# Patient Record
Sex: Male | Born: 1950 | Race: White | Hispanic: No | Marital: Married | Smoking: Former smoker
Health system: Southern US, Community
[De-identification: ages and names within clinical notes are randomized; demographics above are authoritative.]

## PROBLEM LIST (undated history)

## (undated) DIAGNOSIS — S42302A Unspecified fracture of shaft of humerus, left arm, initial encounter for closed fracture: Secondary | ICD-10-CM

## (undated) DIAGNOSIS — I10 Essential (primary) hypertension: Secondary | ICD-10-CM

## (undated) DIAGNOSIS — E785 Hyperlipidemia, unspecified: Secondary | ICD-10-CM

## (undated) DIAGNOSIS — Z973 Presence of spectacles and contact lenses: Secondary | ICD-10-CM

## (undated) DIAGNOSIS — J302 Other seasonal allergic rhinitis: Secondary | ICD-10-CM

## (undated) DIAGNOSIS — S82899A Other fracture of unspecified lower leg, initial encounter for closed fracture: Secondary | ICD-10-CM

## (undated) DIAGNOSIS — I2510 Atherosclerotic heart disease of native coronary artery without angina pectoris: Secondary | ICD-10-CM

## (undated) DIAGNOSIS — M1990 Unspecified osteoarthritis, unspecified site: Secondary | ICD-10-CM

## (undated) DIAGNOSIS — Z974 Presence of external hearing-aid: Secondary | ICD-10-CM

## (undated) HISTORY — PX: WRIST FUSION: SHX839

## (undated) HISTORY — PX: SHOULDER ARTHROSCOPY W/ ROTATOR CUFF REPAIR: SHX2400

## (undated) HISTORY — PX: RHINOPLASTY: SUR1284

## (undated) HISTORY — PX: KNEE SURGERY: SHX244

## (undated) HISTORY — PX: CORONARY ANGIOPLASTY WITH STENT PLACEMENT: SHX49

---

## 2009-01-20 ENCOUNTER — Ambulatory Visit (HOSPITAL_COMMUNITY): Admission: RE | Admit: 2009-01-20 | Discharge: 2009-01-21 | Payer: Self-pay | Admitting: Orthopedic Surgery

## 2011-01-19 LAB — CBC
Platelets: 192 10*3/uL (ref 150–400)
RDW: 13.9 % (ref 11.5–15.5)

## 2011-01-19 LAB — COMPREHENSIVE METABOLIC PANEL
ALT: 21 U/L (ref 0–53)
Albumin: 3.6 g/dL (ref 3.5–5.2)
Alkaline Phosphatase: 60 U/L (ref 39–117)
Calcium: 8.8 mg/dL (ref 8.4–10.5)
Potassium: 4.4 mEq/L (ref 3.5–5.1)
Sodium: 142 mEq/L (ref 135–145)
Total Protein: 5.9 g/dL — ABNORMAL LOW (ref 6.0–8.3)

## 2011-01-19 LAB — APTT: aPTT: 28 seconds (ref 24–37)

## 2011-01-19 LAB — DIFFERENTIAL
Basophils Relative: 1 % (ref 0–1)
Eosinophils Absolute: 0.2 10*3/uL (ref 0.0–0.7)
Lymphs Abs: 1.4 10*3/uL (ref 0.7–4.0)
Monocytes Absolute: 0.6 10*3/uL (ref 0.1–1.0)
Monocytes Relative: 9 % (ref 3–12)
Neutro Abs: 4.2 10*3/uL (ref 1.7–7.7)

## 2011-01-19 LAB — URINALYSIS, ROUTINE W REFLEX MICROSCOPIC
Bilirubin Urine: NEGATIVE
Nitrite: NEGATIVE
Specific Gravity, Urine: 1.022 (ref 1.005–1.030)
Urobilinogen, UA: 1 mg/dL (ref 0.0–1.0)
pH: 7.5 (ref 5.0–8.0)

## 2011-01-19 LAB — URINE MICROSCOPIC-ADD ON

## 2011-02-22 NOTE — Op Note (Signed)
Christopher Bates, Christopher Bates           ACCOUNT NO.:  [id number]   MEDICAL RECORD NO.:  [id number]          PATIENT TYPE:  OIB   LOCATION:  0098                         FACILITY:  [facility]   PHYSICIAN:  Georges Lynch. Gioffre, M.D.DATE OF BIRTH:  Feb 24, 1951   DATE OF PROCEDURE:  DATE OF DISCHARGE:                               OPERATIVE REPORT   PREOPERATIVE DIAGNOSES:  1. Complete tear of the rotator cuff tendon with retraction, left      shoulder.  2. Severe impingement syndrome, left shoulder.   POSTOPERATIVE DIAGNOSES:  1. Complete tear of the rotator cuff tendon with retraction, left      shoulder.  2. Severe impingement syndrome, left shoulder.   OPERATION:  1. Open acromionectomy and acromioplasty, left shoulder.  2. Repair of a completely retracted tear of the rotator cuff tendon,      left shoulder.  3. TissueMend graft to the left shoulder rotator cuff with 1 anchor,      PEEK anchor.   PROCEDURE:  Under general anesthesia, the patient first had 1 gram of IV  Ancef.  Routine orthopedic prep and drape in the left shoulder was  carried out.  Incision was made over the anterior aspect of the left  shoulder, bleeders identified and cauterized.  At this time, the deltoid  tendon was separated by sharp dissection from the acromion.  I  identified the subacromial bursa and I excised that, and it was quite  thickened and inflamed.  I incised a small proximal part of the deltoid  muscle.  I inserted the Bennett retractor to protect the cuff and then  did a partial acromionectomy and acromioplasty, utilizing oscillating  saw on the bur and then thoroughly irrigated out the area, bone waxed  the undersurface of the acromion.  Note, he had a horseshoe-like-type  tear of the rotator cuff with some retraction.  I utilized a bur to bur  down the lateral articular surface of the humerus.  I then inserted one  PEEK anchor.  I then first did a primary repair of the tear and a side-  to-side  suture, and following that I then applied the TissueMend graft  for reinforcement and affixed it to the humerus distally with a PEEK  anchor.  Thoroughly irrigated out the area, reapproximated deltoid  tendon and muscle in the usual fashion.  Subcu was closed with 0-Vicryl,  skin with metal staples.  Sterile Neosporin dressing was applied and he  was placed in a shoulder immobilizer.   SURGEON:  Georges Lynch. Darrelyn Hillock, M.D.   ASSISTANT:  Nurse.            ______________________________  Georges Lynch. Darrelyn Hillock, M.D.     RAG/MEDQ  D:  01/20/2009  T:  01/20/2009  Job:  [id number]

## 2013-06-08 ENCOUNTER — Emergency Department (INDEPENDENT_AMBULATORY_CARE_PROVIDER_SITE_OTHER)
Admission: EM | Admit: 2013-06-08 | Discharge: 2013-06-08 | Disposition: A | Discharge status: Home / Self Care | Payer: Managed Care, Other (non HMO) | Source: Home / Self Care | Attending: Family Medicine | Admitting: Family Medicine

## 2013-06-08 ENCOUNTER — Encounter: Payer: Self-pay | Admitting: Emergency Medicine

## 2013-06-08 DIAGNOSIS — H6980 Other specified disorders of Eustachian tube, unspecified ear: Secondary | ICD-10-CM

## 2013-06-08 DIAGNOSIS — H8110 Benign paroxysmal vertigo, unspecified ear: Secondary | ICD-10-CM

## 2013-06-08 DIAGNOSIS — H6983 Other specified disorders of Eustachian tube, bilateral: Secondary | ICD-10-CM

## 2013-06-08 HISTORY — DX: Other seasonal allergic rhinitis: J30.2

## 2013-06-08 HISTORY — DX: Hyperlipidemia, unspecified: E78.5

## 2013-06-08 HISTORY — DX: Essential (primary) hypertension: I10

## 2013-06-08 HISTORY — DX: Atherosclerotic heart disease of native coronary artery without angina pectoris: I25.10

## 2013-06-08 MED ORDER — MECLIZINE HCL 25 MG PO TABS: ORAL_TABLET | ORAL | Status: AC

## 2013-06-08 MED ORDER — FLUTICASONE PROPIONATE 50 MCG/ACT NA SUSP: NASAL | Status: AC

## 2013-06-08 NOTE — ED Provider Notes (Signed)
CSN: [id number]     Arrival date & time 06/08/13  1100 History   First MD Initiated Contact with Patient 06/08/13 1155     Chief Complaint  Patient presents with  . Dizziness      HPI Comments: Patient complains of persistent sinus congestion and intermittent dizziness since April.  Congestion had not improved with Claritin D.  Two weeks ago his dizziness became more persistent (described as sensation of movement, spinning, off-balance) worse with movement and in bed.  He feels pressure in his ears.  No fevers, chills, and sweats.  No headache.  No neurologic symptoms otherwise.  The history is provided by the patient.    Past Medical History  Diagnosis Date  . Coronary artery disease   . Hypertension   . Hyperlipidemia   . Seasonal allergies    Past Surgical History  Procedure Laterality Date  . Coronary angioplasty with stent placement    . Wrist fusion Right   . Rhinoplasty    . Shoulder arthroscopy w/ rotator cuff repair Left   . Knee surgery Right    Family History  Problem Relation Age of Onset  . Heart failure Father   . Diabetes Father   . Heart failure Sister   . Heart failure Brother   . Heart failure Paternal Uncle    History  Substance Use Topics  . Smoking status: Former Games developer  . Smokeless tobacco: Not on file  . Alcohol Use: Yes    Review of Systems  Constitutional: Negative for fever, chills, activity change and fatigue.  HENT: Positive for ear pain, congestion and postnasal drip. Negative for hearing loss, nosebleeds, sore throat, facial swelling, rhinorrhea, sneezing, trouble swallowing, sinus pressure, tinnitus and ear discharge.   Eyes: Negative.   Respiratory: Negative.   Cardiovascular: Negative.   Gastrointestinal: Negative.   Genitourinary: Negative.   Musculoskeletal: Negative.   Skin: Negative.   Neurological: Positive for dizziness. Negative for syncope, speech difficulty, weakness, light-headedness, numbness and headaches.     Allergies  Review of patient's allergies indicates no known allergies.  Home Medications   Current Outpatient Rx  Name  Route  Sig  Dispense  Refill  . aspirin 81 MG tablet   Oral   Take by mouth daily.         . clopidogrel (PLAVIX) 75 MG tablet   Oral   Take 75 mg by mouth daily.         Marland Kitchen lisinopril-hydrochlorothiazide (PRINZIDE,ZESTORETIC) 10-12.5 MG per tablet   Oral   Take 1 tablet by mouth daily.         Marland Kitchen loratadine-pseudoephedrine (CLARITIN-D 24-HOUR) 10-240 MG per 24 hr tablet   Oral   Take 1 tablet by mouth daily.         . metoprolol succinate (TOPROL-XL) 25 MG 24 hr tablet   Oral   Take 25 mg by mouth daily.         . simvastatin (ZOCOR) 40 MG tablet   Oral   Take 40 mg by mouth every evening.         . fluticasone (FLONASE) 50 MCG/ACT nasal spray      Place two sprays in each nostril once daily   16 g   1   . meclizine (ANTIVERT) 25 MG tablet      Take one tab by mouth 2 or 3 times daily as needed for dizziness   15 tablet   0    BP 121/75  Pulse 69  Temp(Src) 97.9 F (36.6 C) (Oral)  Resp 16  Ht 5\' 11"  (1.803 m)  Wt 195 lb (88.451 kg)  BMI 27.21 kg/m2  SpO2 98% Physical Exam Nursing notes and Vital Signs reviewed. Appearance:  Patient appears healthy, stated age, and in no acute distress Eyes:  Pupils are equal, round, and reactive to light and accomodation.  Extraocular movement is intact.  Conjunctivae are not inflamed.  Fundi are benign.  Slight nystagmus noted on lateral gaze.  Ears:  Canals normal.  Tympanic membranes appear retracted, otherwise normal Nose:  Congested turbinates.  No sinus tenderness.    Pharynx:  Normal Neck:  Supple.  No adenopathy.  Carotids normal. Lungs:  Clear to auscultation.  Breath sounds are equal.  Heart:  Regular rate and rhythm without murmurs, rubs, or gallops.  Abdomen:  Nontender without masses or hepatosplenomegaly.  Bowel sounds are present.  No CVA or flank tenderness.   Extremities:  No edema.  No calf tenderness Skin:  No rash present. Neurologic:  Cranial nerves 2 through 12 are normal.  .      ED Course  Procedures  none        MDM   1. Benign paroxysmal positional vertigo   2. Eustachian tube dysfunction, bilateral    Begin Antivert.  Begin Flonase nasal spray. May use Afrin nasal spray (or generic oxymetazoline) twice daily for about 5 days.  Also recommend using saline nasal spray several times daily and saline nasal irrigation (AYR is a common brand).  Use Flonase nasal spray after using Afrin and saline sprays. Stop all antihistamines for now, and other non-prescription cough/cold preparations. Followup with ENT if not improved two weeks.    Lattie Haw, MD 06/10/13 1028

## 2013-06-08 NOTE — ED Notes (Signed)
Reports intermittent dizziness since April/2014; became very bad during this past night when every change of position worsened it. Feels pressure in both ears.

## 2017-10-27 ENCOUNTER — Encounter (HOSPITAL_COMMUNITY)
Admission: EM | Disposition: A | Discharge status: Home / Self Care | Payer: Self-pay | Source: Home / Self Care | Attending: Emergency Medicine

## 2017-10-27 ENCOUNTER — Emergency Department (HOSPITAL_COMMUNITY): Payer: Medicare Other | Admitting: Anesthesiology

## 2017-10-27 ENCOUNTER — Emergency Department (HOSPITAL_COMMUNITY): Payer: Medicare Other

## 2017-10-27 ENCOUNTER — Observation Stay (HOSPITAL_COMMUNITY): Payer: Medicare Other

## 2017-10-27 ENCOUNTER — Observation Stay (HOSPITAL_COMMUNITY)
Admission: EM | Admit: 2017-10-27 | Discharge: 2017-10-28 | Disposition: A | Discharge status: Home / Self Care | Payer: Medicare Other | Attending: Orthopedic Surgery | Admitting: Orthopedic Surgery

## 2017-10-27 ENCOUNTER — Encounter (HOSPITAL_COMMUNITY): Payer: Self-pay

## 2017-10-27 DIAGNOSIS — Z87891 Personal history of nicotine dependence: Secondary | ICD-10-CM | POA: Insufficient documentation

## 2017-10-27 DIAGNOSIS — Y9389 Activity, other specified: Secondary | ICD-10-CM | POA: Diagnosis not present

## 2017-10-27 DIAGNOSIS — I2510 Atherosclerotic heart disease of native coronary artery without angina pectoris: Secondary | ICD-10-CM | POA: Diagnosis not present

## 2017-10-27 DIAGNOSIS — S8262XA Displaced fracture of lateral malleolus of left fibula, initial encounter for closed fracture: Principal | ICD-10-CM | POA: Insufficient documentation

## 2017-10-27 DIAGNOSIS — Z79899 Other long term (current) drug therapy: Secondary | ICD-10-CM | POA: Insufficient documentation

## 2017-10-27 DIAGNOSIS — Z981 Arthrodesis status: Secondary | ICD-10-CM | POA: Diagnosis not present

## 2017-10-27 DIAGNOSIS — I10 Essential (primary) hypertension: Secondary | ICD-10-CM | POA: Insufficient documentation

## 2017-10-27 DIAGNOSIS — Z833 Family history of diabetes mellitus: Secondary | ICD-10-CM | POA: Insufficient documentation

## 2017-10-27 DIAGNOSIS — R2681 Unsteadiness on feet: Secondary | ICD-10-CM | POA: Diagnosis not present

## 2017-10-27 DIAGNOSIS — Z8249 Family history of ischemic heart disease and other diseases of the circulatory system: Secondary | ICD-10-CM | POA: Diagnosis not present

## 2017-10-27 DIAGNOSIS — R609 Edema, unspecified: Secondary | ICD-10-CM

## 2017-10-27 DIAGNOSIS — Z955 Presence of coronary angioplasty implant and graft: Secondary | ICD-10-CM | POA: Diagnosis not present

## 2017-10-27 DIAGNOSIS — R52 Pain, unspecified: Secondary | ICD-10-CM

## 2017-10-27 DIAGNOSIS — S52592A Other fractures of lower end of left radius, initial encounter for closed fracture: Secondary | ICD-10-CM | POA: Insufficient documentation

## 2017-10-27 DIAGNOSIS — S93315A Dislocation of tarsal joint of left foot, initial encounter: Secondary | ICD-10-CM | POA: Diagnosis not present

## 2017-10-27 DIAGNOSIS — Z419 Encounter for procedure for purposes other than remedying health state, unspecified: Secondary | ICD-10-CM

## 2017-10-27 DIAGNOSIS — S9305XA Dislocation of left ankle joint, initial encounter: Secondary | ICD-10-CM | POA: Diagnosis present

## 2017-10-27 DIAGNOSIS — S82892A Other fracture of left lower leg, initial encounter for closed fracture: Secondary | ICD-10-CM | POA: Diagnosis present

## 2017-10-27 DIAGNOSIS — Y9289 Other specified places as the place of occurrence of the external cause: Secondary | ICD-10-CM | POA: Insufficient documentation

## 2017-10-27 DIAGNOSIS — S82899A Other fracture of unspecified lower leg, initial encounter for closed fracture: Secondary | ICD-10-CM | POA: Diagnosis present

## 2017-10-27 DIAGNOSIS — Z7982 Long term (current) use of aspirin: Secondary | ICD-10-CM | POA: Insufficient documentation

## 2017-10-27 DIAGNOSIS — E785 Hyperlipidemia, unspecified: Secondary | ICD-10-CM | POA: Diagnosis not present

## 2017-10-27 HISTORY — PX: EXTERNAL FIXATION LEG: SHX1549

## 2017-10-27 LAB — CBC
HCT: 44.5 % (ref 39.0–52.0)
Hemoglobin: 14.8 g/dL (ref 13.0–17.0)
MCH: 33.3 pg (ref 26.0–34.0)
MCHC: 33.3 g/dL (ref 30.0–36.0)
MCV: 100.2 fL — ABNORMAL HIGH (ref 78.0–100.0)
PLATELETS: 181 10*3/uL (ref 150–400)
RBC: 4.44 MIL/uL (ref 4.22–5.81)
RDW: 13.4 % (ref 11.5–15.5)
WBC: 11.3 10*3/uL — ABNORMAL HIGH (ref 4.0–10.5)

## 2017-10-27 LAB — BASIC METABOLIC PANEL
Anion gap: 8 (ref 5–15)
BUN: 16 mg/dL (ref 6–20)
CO2: 24 mmol/L (ref 22–32)
CREATININE: 1.17 mg/dL (ref 0.61–1.24)
Calcium: 8.2 mg/dL — ABNORMAL LOW (ref 8.9–10.3)
Chloride: 107 mmol/L (ref 101–111)
GFR calc Af Amer: >60 mL/min (ref >60)
GFR calc non Af Amer: >60 mL/min (ref >60)
Glucose, Bld: 124 mg/dL — ABNORMAL HIGH (ref 65–99)
Potassium: 4.9 mmol/L (ref 3.5–5.1)
SODIUM: 139 mmol/L (ref 135–145)

## 2017-10-27 LAB — MRSA PCR SCREENING: MRSA by PCR: NEGATIVE

## 2017-10-27 SURGERY — EXTERNAL FIXATION, LOWER EXTREMITY
Anesthesia: Monitor Anesthesia Care | Laterality: Left

## 2017-10-27 MED ORDER — DEXAMETHASONE SODIUM PHOSPHATE 10 MG/ML IJ SOLN
INTRAMUSCULAR | Status: AC
  Filled 2017-10-27: qty 1

## 2017-10-27 MED ORDER — ACETAMINOPHEN 500 MG PO TABS
1000.0000 mg | ORAL_TABLET | Freq: Four times a day (QID) | ORAL | Status: DC
  Administered 2017-10-27 – 2017-10-28 (×2): 1000 mg via ORAL
  Filled 2017-10-27 (×3): qty 2

## 2017-10-27 MED ORDER — PROPOFOL 10 MG/ML IV BOLUS
INTRAVENOUS | Status: DC | PRN
  Administered 2017-10-27: 30 ug via INTRAVENOUS
  Administered 2017-10-27 (×3): 50 ug via INTRAVENOUS

## 2017-10-27 MED ORDER — GLYCOPYRROLATE 0.2 MG/ML IJ SOLN
INTRAMUSCULAR | Status: DC | PRN
  Administered 2017-10-27: 0.4 mg via INTRAVENOUS

## 2017-10-27 MED ORDER — FENTANYL CITRATE (PF) 100 MCG/2ML IJ SOLN
INTRAMUSCULAR | Status: AC
  Filled 2017-10-27: qty 2

## 2017-10-27 MED ORDER — BUPIVACAINE-EPINEPHRINE (PF) 0.5% -1:200000 IJ SOLN
INTRAMUSCULAR | Status: AC
  Filled 2017-10-27: qty 30

## 2017-10-27 MED ORDER — ROCURONIUM BROMIDE 10 MG/ML (PF) SYRINGE
PREFILLED_SYRINGE | INTRAVENOUS | Status: AC
  Filled 2017-10-27: qty 5

## 2017-10-27 MED ORDER — LISINOPRIL-HYDROCHLOROTHIAZIDE 10-12.5 MG PO TABS: 1.0000 | ORAL_TABLET | Freq: Every day | ORAL | Status: DC

## 2017-10-27 MED ORDER — OXYCODONE HCL 5 MG PO TABS
5.0000 mg | ORAL_TABLET | ORAL | Status: DC | PRN
  Administered 2017-10-27 – 2017-10-28 (×3): 10 mg via ORAL
  Filled 2017-10-27 (×3): qty 2

## 2017-10-27 MED ORDER — LACTATED RINGERS IV SOLN: INTRAVENOUS | Status: DC

## 2017-10-27 MED ORDER — SODIUM CHLORIDE 0.9 % IR SOLN
Status: DC | PRN
  Administered 2017-10-27: 1000 mL

## 2017-10-27 MED ORDER — SUCCINYLCHOLINE CHLORIDE 20 MG/ML IJ SOLN
INTRAMUSCULAR | Status: DC | PRN
  Administered 2017-10-27: 120 mg via INTRAVENOUS

## 2017-10-27 MED ORDER — ONDANSETRON HCL 4 MG/2ML IJ SOLN
INTRAMUSCULAR | Status: DC | PRN
  Administered 2017-10-27: 4 mg via INTRAVENOUS

## 2017-10-27 MED ORDER — FENTANYL CITRATE (PF) 250 MCG/5ML IJ SOLN
INTRAMUSCULAR | Status: AC
  Filled 2017-10-27: qty 5

## 2017-10-27 MED ORDER — ASPIRIN 81 MG PO CHEW
81.0000 mg | CHEWABLE_TABLET | Freq: Every day | ORAL | Status: DC
  Administered 2017-10-28: 81 mg via ORAL
  Filled 2017-10-27: qty 1

## 2017-10-27 MED ORDER — NEOSTIGMINE METHYLSULFATE 5 MG/5ML IV SOSY
PREFILLED_SYRINGE | INTRAVENOUS | Status: AC
  Filled 2017-10-27: qty 5

## 2017-10-27 MED ORDER — PROPOFOL 10 MG/ML IV BOLUS
INTRAVENOUS | Status: DC | PRN
  Administered 2017-10-27: 150 mg via INTRAVENOUS
  Administered 2017-10-27: 50 mg via INTRAVENOUS

## 2017-10-27 MED ORDER — FENTANYL CITRATE (PF) 100 MCG/2ML IJ SOLN
25.0000 ug | INTRAMUSCULAR | Status: DC | PRN
  Administered 2017-10-27: 50 ug via INTRAVENOUS

## 2017-10-27 MED ORDER — PROPOFOL 10 MG/ML IV BOLUS
INTRAVENOUS | Status: AC
  Filled 2017-10-27: qty 20

## 2017-10-27 MED ORDER — SUCCINYLCHOLINE CHLORIDE 200 MG/10ML IV SOSY
PREFILLED_SYRINGE | INTRAVENOUS | Status: AC
  Filled 2017-10-27: qty 10

## 2017-10-27 MED ORDER — PROPOFOL 10 MG/ML IV BOLUS
0.5000 mg/kg | Freq: Once | INTRAVENOUS | Status: DC
  Administered 2017-10-27: 15:00:00 via INTRAVENOUS

## 2017-10-27 MED ORDER — PROPOFOL 10 MG/ML IV BOLUS
INTRAVENOUS | Status: AC
  Administered 2017-10-27: 90 mg
  Filled 2017-10-27: qty 20

## 2017-10-27 MED ORDER — FENTANYL CITRATE (PF) 100 MCG/2ML IJ SOLN
INTRAMUSCULAR | Status: AC
  Administered 2017-10-27: 50 ug via INTRAVENOUS
  Filled 2017-10-27: qty 2

## 2017-10-27 MED ORDER — SENNA 8.6 MG PO TABS
1.0000 | ORAL_TABLET | Freq: Two times a day (BID) | ORAL | Status: DC
  Administered 2017-10-27 – 2017-10-28 (×2): 8.6 mg via ORAL
  Filled 2017-10-27 (×2): qty 1

## 2017-10-27 MED ORDER — ROCURONIUM BROMIDE 100 MG/10ML IV SOLN
INTRAVENOUS | Status: DC | PRN
  Administered 2017-10-27: 40 mg via INTRAVENOUS

## 2017-10-27 MED ORDER — FENTANYL CITRATE (PF) 100 MCG/2ML IJ SOLN
50.0000 ug | Freq: Once | INTRAMUSCULAR | Status: AC
  Administered 2017-10-27: 50 ug via INTRAVENOUS
  Filled 2017-10-27: qty 2

## 2017-10-27 MED ORDER — CEFAZOLIN SODIUM-DEXTROSE 2-4 GM/100ML-% IV SOLN
2.0000 g | INTRAVENOUS | Status: DC
  Filled 2017-10-27: qty 100

## 2017-10-27 MED ORDER — METHOCARBAMOL 500 MG PO TABS: 500.0000 mg | ORAL_TABLET | Freq: Four times a day (QID) | ORAL | Status: DC | PRN

## 2017-10-27 MED ORDER — OXYCODONE HCL 5 MG PO TABS: 5.0000 mg | ORAL_TABLET | ORAL | 0 refills | Status: DC | PRN

## 2017-10-27 MED ORDER — CLOPIDOGREL BISULFATE 75 MG PO TABS
75.0000 mg | ORAL_TABLET | Freq: Every day | ORAL | Status: DC
  Administered 2017-10-28: 75 mg via ORAL
  Filled 2017-10-27: qty 1

## 2017-10-27 MED ORDER — MIDAZOLAM HCL 2 MG/2ML IJ SOLN
INTRAMUSCULAR | Status: AC
  Filled 2017-10-27: qty 2

## 2017-10-27 MED ORDER — BUPIVACAINE-EPINEPHRINE (PF) 0.5% -1:200000 IJ SOLN
INTRAMUSCULAR | Status: DC | PRN
  Administered 2017-10-27: 20 mL

## 2017-10-27 MED ORDER — ONDANSETRON HCL 4 MG/2ML IJ SOLN
INTRAMUSCULAR | Status: AC
  Filled 2017-10-27: qty 2

## 2017-10-27 MED ORDER — POVIDONE-IODINE 10 % EX SWAB: 2.0000 "application " | Freq: Once | CUTANEOUS | Status: DC

## 2017-10-27 MED ORDER — MIDAZOLAM HCL 5 MG/5ML IJ SOLN
INTRAMUSCULAR | Status: DC | PRN
  Administered 2017-10-27: 2 mg via INTRAVENOUS

## 2017-10-27 MED ORDER — ONDANSETRON HCL 4 MG/2ML IJ SOLN: 4.0000 mg | Freq: Once | INTRAMUSCULAR | Status: DC | PRN

## 2017-10-27 MED ORDER — FENTANYL CITRATE (PF) 100 MCG/2ML IJ SOLN
INTRAMUSCULAR | Status: DC | PRN
  Administered 2017-10-27 (×2): 50 ug via INTRAVENOUS
  Administered 2017-10-27: 100 ug via INTRAVENOUS
  Administered 2017-10-27: 50 ug via INTRAVENOUS

## 2017-10-27 MED ORDER — DEXAMETHASONE SODIUM PHOSPHATE 10 MG/ML IJ SOLN
INTRAMUSCULAR | Status: DC | PRN
  Administered 2017-10-27: 10 mg via INTRAVENOUS

## 2017-10-27 MED ORDER — ONDANSETRON 4 MG PO TBDP: 4.0000 mg | ORAL_TABLET | Freq: Three times a day (TID) | ORAL | 0 refills | Status: AC | PRN

## 2017-10-27 MED ORDER — NEOSTIGMINE METHYLSULFATE 10 MG/10ML IV SOLN
INTRAVENOUS | Status: DC | PRN
Start: 2017-10-27 — End: 2017-10-27
  Administered 2017-10-27: 3 mg via INTRAVENOUS

## 2017-10-27 MED ORDER — HYDROCHLOROTHIAZIDE 12.5 MG PO CAPS
12.5000 mg | ORAL_CAPSULE | Freq: Every day | ORAL | Status: DC
  Administered 2017-10-28: 12.5 mg via ORAL
  Filled 2017-10-27: qty 1

## 2017-10-27 MED ORDER — FENTANYL CITRATE (PF) 100 MCG/2ML IJ SOLN
INTRAMUSCULAR | Status: DC | PRN
  Administered 2017-10-27 (×2): 50 ug via INTRAVENOUS

## 2017-10-27 MED ORDER — ONDANSETRON HCL 4 MG/2ML IJ SOLN: 4.0000 mg | Freq: Four times a day (QID) | INTRAMUSCULAR | Status: DC | PRN

## 2017-10-27 MED ORDER — METHOCARBAMOL 1000 MG/10ML IJ SOLN: 500.0000 mg | Freq: Four times a day (QID) | INTRAMUSCULAR | Status: DC | PRN

## 2017-10-27 MED ORDER — SIMVASTATIN 40 MG PO TABS
40.0000 mg | ORAL_TABLET | Freq: Every evening | ORAL | Status: DC
  Administered 2017-10-27: 40 mg via ORAL
  Filled 2017-10-27: qty 1

## 2017-10-27 MED ORDER — LIDOCAINE 2% (20 MG/ML) 5 ML SYRINGE
INTRAMUSCULAR | Status: AC
  Filled 2017-10-27: qty 5

## 2017-10-27 MED ORDER — LACTATED RINGERS IV SOLN
INTRAVENOUS | Status: DC | PRN
  Administered 2017-10-27: 17:00:00 via INTRAVENOUS

## 2017-10-27 MED ORDER — MORPHINE SULFATE (PF) 2 MG/ML IV SOLN: 2.0000 mg | INTRAVENOUS | Status: DC | PRN

## 2017-10-27 MED ORDER — ONDANSETRON HCL 4 MG PO TABS
4.0000 mg | ORAL_TABLET | Freq: Four times a day (QID) | ORAL | Status: DC | PRN
Start: 2017-10-27 — End: 2017-10-28

## 2017-10-27 MED ORDER — LISINOPRIL 10 MG PO TABS
10.0000 mg | ORAL_TABLET | Freq: Every day | ORAL | Status: DC
  Administered 2017-10-28: 10 mg via ORAL
  Filled 2017-10-27: qty 1

## 2017-10-27 MED ORDER — CHLORHEXIDINE GLUCONATE 4 % EX LIQD: 60.0000 mL | Freq: Once | CUTANEOUS | Status: DC

## 2017-10-27 SURGICAL SUPPLY — 43 items
BANDAGE ACE 4X5 VEL STRL LF (GAUZE/BANDAGES/DRESSINGS) ×3 IMPLANT
BANDAGE ACE 6X5 VEL STRL LF (GAUZE/BANDAGES/DRESSINGS) ×3 IMPLANT
BANDAGE ESMARK 6X9 LF (GAUZE/BANDAGES/DRESSINGS) ×1 IMPLANT
BNDG COHESIVE 6X5 TAN STRL LF (GAUZE/BANDAGES/DRESSINGS) ×3 IMPLANT
BNDG ESMARK 6X9 LF (GAUZE/BANDAGES/DRESSINGS) ×3
BNDG GAUZE ELAST 4 BULKY (GAUZE/BANDAGES/DRESSINGS) ×6 IMPLANT
COVER SURGICAL LIGHT HANDLE (MISCELLANEOUS) ×3 IMPLANT
DECANTER SPIKE VIAL GLASS SM (MISCELLANEOUS) ×3 IMPLANT
DRAPE C-ARM 42X72 X-RAY (DRAPES) ×3 IMPLANT
DRAPE C-ARMOR (DRAPES) ×3 IMPLANT
DRAPE U-SHAPE 47X51 STRL (DRAPES) ×3 IMPLANT
DRSG PAD ABDOMINAL 8X10 ST (GAUZE/BANDAGES/DRESSINGS) ×3 IMPLANT
ELECT REM PT RETURN 9FT ADLT (ELECTROSURGICAL) ×3
ELECTRODE REM PT RTRN 9FT ADLT (ELECTROSURGICAL) ×1 IMPLANT
GAUZE SPONGE 4X4 12PLY STRL (GAUZE/BANDAGES/DRESSINGS) ×3 IMPLANT
GAUZE XEROFORM 5X9 LF (GAUZE/BANDAGES/DRESSINGS) ×3 IMPLANT
GLOVE BIO SURGEON STRL SZ7.5 (GLOVE) ×3 IMPLANT
GLOVE BIOGEL PI IND STRL 8 (GLOVE) ×1 IMPLANT
GLOVE BIOGEL PI INDICATOR 8 (GLOVE) ×2
GOWN STRL REUS W/ TWL LRG LVL3 (GOWN DISPOSABLE) ×2 IMPLANT
GOWN STRL REUS W/ TWL XL LVL3 (GOWN DISPOSABLE) ×1 IMPLANT
GOWN STRL REUS W/TWL LRG LVL3 (GOWN DISPOSABLE) ×4
GOWN STRL REUS W/TWL XL LVL3 (GOWN DISPOSABLE) ×2
HANDPIECE INTERPULSE COAX TIP (DISPOSABLE)
KIT BASIN OR (CUSTOM PROCEDURE TRAY) ×3 IMPLANT
KIT ROOM TURNOVER OR (KITS) ×3 IMPLANT
NEEDLE 22X1 1/2 (OR ONLY) (NEEDLE) ×3 IMPLANT
NS IRRIG 1000ML POUR BTL (IV SOLUTION) ×3 IMPLANT
PACK ORTHO EXTREMITY (CUSTOM PROCEDURE TRAY) ×3 IMPLANT
PAD ARMBOARD 7.5X6 YLW CONV (MISCELLANEOUS) ×6 IMPLANT
PADDING CAST COTTON 6X4 STRL (CAST SUPPLIES) ×3 IMPLANT
SET HNDPC FAN SPRY TIP SCT (DISPOSABLE) IMPLANT
SPONGE LAP 18X18 X RAY DECT (DISPOSABLE) ×3 IMPLANT
STAPLER VISISTAT 35W (STAPLE) IMPLANT
STOCKINETTE IMPERVIOUS LG (DRAPES) ×3 IMPLANT
SYR CONTROL 10ML LL (SYRINGE) ×3 IMPLANT
TOWEL OR 17X24 6PK STRL BLUE (TOWEL DISPOSABLE) ×6 IMPLANT
TOWEL OR 17X26 10 PK STRL BLUE (TOWEL DISPOSABLE) ×6 IMPLANT
TUBE CONNECTING 12'X1/4 (SUCTIONS) ×1
TUBE CONNECTING 12X1/4 (SUCTIONS) ×2 IMPLANT
UNDERPAD 30X30 (UNDERPADS AND DIAPERS) ×3 IMPLANT
WATER STERILE IRR 1000ML POUR (IV SOLUTION) ×6 IMPLANT
YANKAUER SUCT BULB TIP NO VENT (SUCTIONS) ×3 IMPLANT

## 2017-10-27 NOTE — Anesthesia Postprocedure Evaluation (Signed)
Anesthesia Post Note  Patient: Christopher Bates  Procedure(s) Performed: CLOSED  and OPEN REDUCTION ANKLE , (Left )     Patient location during evaluation: PACU Anesthesia Type: MAC Level of consciousness: awake and alert Pain management: pain level controlled Vital Signs Assessment: post-procedure vital signs reviewed and stable Respiratory status: spontaneous breathing, nonlabored ventilation, respiratory function stable and patient connected to nasal cannula oxygen Cardiovascular status: blood pressure returned to baseline and stable Postop Assessment: no apparent nausea or vomiting Anesthetic complications: no    Last Vitals:  Vitals:   10/27/17 2000 10/27/17 2020  BP:  140/65  Pulse: 70 67  Resp: 18 18  Temp: 37.3 C 37.6 C  SpO2: 97% 99%    Last Pain:  Vitals:   10/27/17 2023  TempSrc:   PainSc: 4                  Nethan Caudillo COKER

## 2017-10-27 NOTE — Progress Notes (Signed)
Orthopedic Tech Progress Note Patient Details:  Baldo DaubRodney D Brunkhorst 04/26/1951 [id number]  Ortho Devices Type of Ortho Device: Ace wrap, Short leg splint, Stirrup splint Ortho Device/Splint Location: Reduction Ortho Device/Splint Interventions: Application   Post Interventions Patient Tolerated: Well Instructions Provided: Care of device, Adjustment of device   Saul FordyceJennifer C Heman Que 10/27/2017, 1:10 PM
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
## 2017-10-27 NOTE — Sedation Documentation (Signed)
Dr Erma HeritageIsaacs, 2 RN's. Ortho MD and PA in room for reduction attempt #2. Zoll at bedside, ambu bag at bedside. VSS.

## 2017-10-27 NOTE — Sedation Documentation (Signed)
Ortho MD unsuccessful at reduction of left ankle. Pt will go to the OR. Pt is stable at this time, responsive to pain.

## 2017-10-27 NOTE — Transfer of Care (Signed)
Immediate Anesthesia Transfer of Care Note  Patient: Baldo DaubRodney D Marinello  Procedure(s) Performed: CLOSED  and OPEN REDUCTION ANKLE , (Left )  Patient Location: PACU  Anesthesia Type:General  Level of Consciousness: awake, alert , oriented and patient cooperative  Airway & Oxygen Therapy: Patient Spontanous Breathing and Patient connected to nasal cannula oxygen  Post-op Assessment: Report given to RN and Post -op Vital signs reviewed and stable  Post vital signs: Reviewed and stable  Last Vitals:  Vitals:   10/27/17 1600 10/27/17 1615  BP: 114/78 (!) 163/86  Pulse: 68 72  Resp: 12 16  Temp:    SpO2: 99% 98%    Last Pain:  Vitals:   10/27/17 1452  TempSrc:   PainSc: 8          Complications: No apparent anesthesia complications

## 2017-10-27 NOTE — ED Provider Notes (Signed)
I was called to the bedside.  Orthopedics is requesting repeat sedation for second attempt at reduction.  Patient has been consented.  He has no direct contraindications to a recurrent sedation.  Patient tolerated propofol well.  Patient subsequently sedated as above.  He tolerated the procedure well but orthopedics was unable to reduce the injury.  Will be taken to the OR.  .Sedation Date/Time: 10/27/2017 4:01 PM Performed by: Shaune PollackIsaacs, Ott Zimmerle, MD Authorized by: Shaune PollackIsaacs, Chibueze Beasley, MD   Consent:    Consent obtained:  Verbal   Consent given by:  Patient   Risks discussed:  Allergic reaction, dysrhythmia, inadequate sedation, nausea, prolonged hypoxia resulting in organ damage, prolonged sedation necessitating reversal, respiratory compromise necessitating ventilatory assistance and intubation and vomiting   Alternatives discussed:  Analgesia without sedation, anxiolysis and regional anesthesia Universal protocol:    Procedure explained and questions answered to patient or proxy's satisfaction: yes     Relevant documents present and verified: yes     Test results available and properly labeled: yes     Imaging studies available: yes     Required blood products, implants, devices, and special equipment available: yes     Site/side marked: yes     Immediately prior to procedure a time out was called: yes     Patient identity confirmation method:  Verbally with patient Indications:    Procedure necessitating sedation performed by:  Physician performing sedation   Intended level of sedation:  Deep Pre-sedation assessment:    Time since last food or drink:  6   NPO status caution: urgency dictates proceeding with non-ideal NPO status     ASA classification: class 1 - normal, healthy patient     Neck mobility: normal     Mouth opening:  3 or more finger widths   Thyromental distance:  4 finger widths   Mallampati score:  I - soft palate, uvula, fauces, pillars visible   Pre-sedation assessments  completed and reviewed: airway patency, cardiovascular function, hydration status, mental status, nausea/vomiting, pain level, respiratory function and temperature   Immediate pre-procedure details:    Reassessment: Patient reassessed immediately prior to procedure     Reviewed: vital signs, relevant labs/tests and NPO status     Verified: bag valve mask available, emergency equipment available, intubation equipment available, IV patency confirmed, oxygen available and suction available   Procedure details (see MAR for exact dosages):    Preoxygenation:  Nasal cannula   Sedation:  Propofol   Intra-procedure monitoring:  Blood pressure monitoring, cardiac monitor, continuous pulse oximetry, frequent LOC assessments, frequent vital sign checks and continuous capnometry   Intra-procedure events: none     Total Provider sedation time (minutes):  25 Post-procedure details:    Attendance: Constant attendance by certified staff until patient recovered     Recovery: Patient returned to pre-procedure baseline     Post-sedation assessments completed and reviewed: airway patency, cardiovascular function, hydration status, mental status, nausea/vomiting, pain level, respiratory function and temperature     Patient is stable for discharge or admission: yes     Patient tolerance:  Tolerated well, no immediate complications      Shaune PollackIsaacs, Nancy Manuele, MD 10/27/17 [phone]
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
## 2017-10-27 NOTE — Discharge Instructions (Signed)
Orthopedic discharge instructions:  -Nonweightbearing to the left lower extremity.  Use crutches and/or walker as directed by her therapist. -Maintain the splint at all times to the left ankle.  Do not get the splint wet. -Elevate the left lower extremity with "toes above nose "at all times. -Resume your aspirin and Plavix as before surgery.  This will serve as a preventative measure as well for lower extremity blood clots. -For mild to moderate pain take ibuprofen and/or Tylenol as needed.  For moderate to severe pain take oxycodone as needed.  -Return to see Dr. Aundria Rudogers in 3 weeks for suture removal.

## 2017-10-27 NOTE — Anesthesia Preprocedure Evaluation (Addendum)
Anesthesia Evaluation  Patient identified by MRN, date of birth, ID band Patient awake    Reviewed: Allergy & Precautions, NPO status , Patient's Chart, lab work & pertinent test results  Airway Mallampati: II  TM Distance: >3 FB Neck ROM: Full    Dental  (+) Missing,    Pulmonary former smoker,    Pulmonary exam normal breath sounds clear to auscultation       Cardiovascular Exercise Tolerance: Good hypertension, Pt. on medications + CAD and + Cardiac Stents (x 3, last placed in 2008)  Normal cardiovascular exam Rhythm:Regular Rate:Normal  Sees cardiologist Doneta Public) with [facility]   Neuro/Psych negative neurological ROS  negative psych ROS   GI/Hepatic negative GI ROS, Neg liver ROS,   Endo/Other  negative endocrine ROS  Renal/GU negative Renal ROS     Musculoskeletal negative musculoskeletal ROS (+)   Abdominal   Peds  Hematology HLD   Anesthesia Other Findings left subtablar dislocation ankle  Reproductive/Obstetrics                            Anesthesia Physical Anesthesia Plan  ASA: III and emergent  Anesthesia Plan: MAC   Post-op Pain Management:    Induction: Intravenous  PONV Risk Score and Plan: 2 and Ondansetron and Treatment may vary due to age or medical condition  Airway Management Planned: Simple Face Mask  Additional Equipment:   Intra-op Plan:   Post-operative Plan: Extubation in OR  Informed Consent: I have reviewed the patients History and Physical, chart, labs and discussed the procedure including the risks, benefits and alternatives for the proposed anesthesia with the patient or authorized representative who has indicated his/her understanding and acceptance.   Dental advisory given  Plan Discussed with: CRNA  Anesthesia Plan Comments: (Potential post op regional anesthetic discussed with patient and family)      Anesthesia Quick Evaluation

## 2017-10-27 NOTE — Brief Op Note (Signed)
10/27/2017  6:42 PM  PATIENT:  Christopher Bates  67 y.o. male  PRE-OPERATIVE DIAGNOSIS:  left subtablar dislocation ankle  POST-OPERATIVE DIAGNOSIS:  left subtablar dislocation ankle  PROCEDURE:  Procedure(s): CLOSED  and OPEN REDUCTION ANKLE , (Left)  SURGEON:  Surgeon(s) and Role:    * Yolonda Kidaogers, Jason Patrick, MD - Primary  PHYSICIAN ASSISTANT:   ASSISTANTS: none   ANESTHESIA:   general  EBL:  10 cc  BLOOD ADMINISTERED:none  DRAINS: none   LOCAL MEDICATIONS USED:  MARCAINE     SPECIMEN:  No Specimen  DISPOSITION OF SPECIMEN:  N/A  COUNTS:  YES  TOURNIQUET:   Total Tourniquet Time Documented: Thigh (Left) - 33 minutes Total: Thigh (Left) - 33 minutes   DICTATION: .Note written in EPIC  PLAN OF CARE: Admit for overnight observation  PATIENT DISPOSITION:  PACU - hemodynamically stable.   Delay start of Pharmacological VTE agent (>24hrs) due to surgical blood loss or risk of bleeding: not applicable

## 2017-10-27 NOTE — ED Notes (Signed)
Pt now alert and oriented x4.

## 2017-10-28 ENCOUNTER — Observation Stay (HOSPITAL_COMMUNITY): Payer: Medicare Other

## 2017-10-28 ENCOUNTER — Encounter (HOSPITAL_COMMUNITY): Payer: Self-pay | Admitting: Orthopedic Surgery

## 2017-10-28 DIAGNOSIS — S8262XA Displaced fracture of lateral malleolus of left fibula, initial encounter for closed fracture: Secondary | ICD-10-CM | POA: Diagnosis not present

## 2017-10-28 MED ORDER — ASPIRIN 81 MG PO TABS: 325.0000 mg | ORAL_TABLET | Freq: Two times a day (BID) | ORAL | Status: AC

## 2017-10-28 NOTE — Evaluation (Signed)
Physical Therapy Evaluation Patient Details Name: Christopher Bates MRN: [id number] DOB: August 16, 1951 Today's Date: 10/28/2017   History of Present Illness  Pt s/p closed and open reduction of left ankle due to comminuted talus fx.  Clinical Impression  Patient reported left wrist pain whenich he had no previously reported to the MD. His wrist is purple and swollen. Nursing notified. He was not sure if he could weight bear on it. Therapy mobilized the patient using a left plafrom walker. He required min a to transfer but otherwise did well. He would benefit from further skilled acute PT for stair training.     Follow Up Recommendations  Home     Equipment Recommendations  (platform walker )    Recommendations for Other Services   None required     Precautions / Restrictions Precautions Precautions: Fall Restrictions Weight Bearing Restrictions: Yes LLE Weight Bearing: Non weight bearing      Mobility  Bed Mobility Overal bed mobility: Modified Independent                Transfers Overall transfer level: Needs assistance Equipment used: Left platform walker Transfers: Sit to/from Stand Sit to Stand: Min assist         General transfer comment: Min a for strength to stand from bed.   Ambulation/Gait Ambulation/Gait assistance: Min guard Ambulation Distance (Feet): 15 Feet Assistive device: Left platform walker       General Gait Details: left platrofrm walker used 2nd to swollen wrist. He was able to safely ambualte with a step too gait patten around his room.   Stairs Stairs: Yes       General stair comments: Patient has 2 steps to get up at home. He had a knee replacement recently and home health showed him how to get up backwards. Therapy also verbally reviewed how to get up forards if he has help and his stairs arent too high. He will have help getting up when he goes home.   Wheelchair Mobility    Modified Rankin (Stroke Patients Only)        Balance Overall balance assessment: Needs assistance Sitting-balance support: No upper extremity supported Sitting balance-Leahy Scale: Good     Standing balance support: Bilateral upper extremity supported Standing balance-Leahy Scale: Poor                               Pertinent Vitals/Pain Pain Assessment: Faces Faces Pain Scale: Hurts a little bit Pain Location: left ankle  Pain Descriptors / Indicators: Sore;Discomfort Pain Intervention(s): Monitored during session    Home Living Family/patient expects to be discharged to:: Private residence Living Arrangements: Spouse/significant other Available Help at Discharge: Family;Available 24 hours/day Type of Home: House       Home Layout: One level Home Equipment: Shower seat;Grab bars - toilet      Prior Function Level of Independence: Independent               Hand Dominance        Extremity/Trunk Assessment   Upper Extremity Assessment Upper Extremity Assessment: Defer to OT evaluation LUE Deficits / Details: left wrist pain with extension/flexion LUE: Unable to fully assess due to pain    Lower Extremity Assessment Lower Extremity Assessment: LLE deficits/detail LLE: Unable to fully assess due to immobilization       Communication   Communication: No difficulties  Cognition Arousal/Alertness: Awake/alert Behavior During Therapy: WFL for tasks assessed/performed Overall  Cognitive Status: Within Functional Limits for tasks assessed                                        General Comments      Exercises     Assessment/Plan    PT Assessment Patient needs continued PT services  PT Problem List Decreased strength;Decreased range of motion;Decreased activity tolerance;Decreased balance;Decreased knowledge of use of DME;Pain       PT Treatment Interventions DME instruction;Gait training;Stair training;Functional mobility training;Therapeutic activities;Therapeutic  exercise;Neuromuscular re-education    PT Goals (Current goals can be found in the Care Plan section)  Acute Rehab PT Goals Patient Stated Goal: to return home PT Goal Formulation: With patient Time For Goal Achievement: 11/04/17 Potential to Achieve Goals: Good    Frequency Min 3X/week   Barriers to discharge        Co-evaluation               AM-PAC PT "6 Clicks" Daily Activity  Outcome Measure Difficulty turning over in bed (including adjusting bedclothes, sheets and blankets)?: None Difficulty moving from lying on back to sitting on the side of the bed? : None Difficulty sitting down on and standing up from a chair with arms (e.g., wheelchair, bedside commode, etc,.)?: A Little Help needed moving to and from a bed to chair (including a wheelchair)?: A Little Help needed walking in hospital room?: A Little Help needed climbing 3-5 steps with a railing? : A Lot 6 Click Score: 19    End of Session Equipment Utilized During Treatment: Gait belt Activity Tolerance: Patient tolerated treatment well Patient left: in bed Nurse Communication: Mobility status PT Visit Diagnosis: Other abnormalities of gait and mobility (R26.89);Pain Pain - Right/Left: Left Pain - part of body: Ankle and joints of foot    Time: 0950-1020 PT Time Calculation (min) (ACUTE ONLY): 30 min   Charges:   PT Evaluation $PT Eval Low Complexity: 1 Low     PT G Codes:         Dessie Comaavid J Tonjua Rossetti PT DPT  10/28/2017, 12:24 PM

## 2017-10-28 NOTE — Evaluation (Signed)
Occupational Therapy Evaluation Patient Details Name: Christopher DaubRodney D Norem MRN: [id number] DOB: 04/18/1951 Today's Date: 10/28/2017    History of Present Illness Pt s/p closed and open reduction of left ankle due to comminuted talus fx.   Clinical Impression   Pt presents with decreased independence with ADLs and functional mobility due to NWB status of left LE and painful left wrist.  Therapist observed left wrist swelling and bruising. RN made aware and to request xray order from MD.  Use of left platform walker during evaluation as precaution until imaging of left wrist is completed.  Pt requiring supervision-min assist with functional mobility and ADLs.  He will have assist from wife at home.  Will continue to follow acutely in order to address deficits listed below.     Follow Up Recommendations  No OT follow up;Supervision/Assistance - 24 hour    Equipment Recommendations  None recommended by OT    Recommendations for Other Services       Precautions / Restrictions Precautions Precautions: Fall Restrictions Weight Bearing Restrictions: Yes LLE Weight Bearing: Non weight bearing      Mobility Bed Mobility Overal bed mobility: Modified Independent                Transfers Overall transfer level: Needs assistance Equipment used: Left platform walker Transfers: Sit to/from Stand Sit to Stand: Min assist         General transfer comment: Min assist for power up from low bed surface.    Balance                                           ADL either performed or assessed with clinical judgement   ADL Overall ADL's : Needs assistance/impaired Eating/Feeding: Independent;Sitting   Grooming: Wash/dry hands;Wash/dry face;Brushing hair;Set up;Sitting   Upper Body Bathing: Set up;Supervision/ safety;Sitting   Lower Body Bathing: Minimal assistance;Sit to/from stand   Upper Body Dressing : Supervision/safety;Set up;Sitting   Lower Body  Dressing: Minimal assistance;Sit to/from stand   Toilet Transfer: Minimal assistance;Ambulation;RW(platform walker)   Toileting- Clothing Manipulation and Hygiene: Minimal assistance;Sit to/from stand       Functional mobility during ADLs: Min guard;Rolling walker(platform walker) General ADL Comments: Use of platform walker to support left wrist due to left wrist pain (observed bruising and swelling).  Educated both patient and wife on ADL techniques and home safety awareness (example, removing tripping hazards).     Vision         Perception     Praxis      Pertinent Vitals/Pain Pain Assessment: Faces Faces Pain Scale: Hurts little more Pain Location: left wrist, volar side Pain Descriptors / Indicators: Sore;Discomfort Pain Intervention(s): Ice applied;Monitored during session     Hand Dominance     Extremity/Trunk Assessment Upper Extremity Assessment Upper Extremity Assessment: LUE deficits/detail LUE Deficits / Details: left wrist pain with extension/flexion LUE: Unable to fully assess due to pain           Communication Communication Communication: No difficulties   Cognition Arousal/Alertness: Awake/alert Behavior During Therapy: WFL for tasks assessed/performed Overall Cognitive Status: Within Functional Limits for tasks assessed                                     General Comments       Exercises  Shoulder Instructions      Home Living Family/patient expects to be discharged to:: Private residence Living Arrangements: Spouse/significant other Available Help at Discharge: Family;Available 24 hours/day Type of Home: House       Home Layout: One level     Bathroom Shower/Tub: Producer, television/film/video: Standard     Home Equipment: Shower seat;Grab bars - toilet          Prior Functioning/Environment Level of Independence: Independent                 OT Problem List: Impaired balance (sitting and/or  standing);Decreased knowledge of use of DME or AE;Decreased knowledge of precautions;Pain;Impaired UE functional use      OT Treatment/Interventions: Self-care/ADL training;Therapeutic exercise;DME and/or AE instruction;Therapeutic activities;Patient/family education;Balance training    OT Goals(Current goals can be found in the care plan section) Acute Rehab OT Goals Patient Stated Goal: to return home OT Goal Formulation: With patient/family Time For Goal Achievement: 11/11/17 Potential to Achieve Goals: Good  OT Frequency: Min 2X/week   Barriers to D/C:            Co-evaluation              AM-PAC PT "6 Clicks" Daily Activity     Outcome Measure Help from another person eating meals?: None Help from another person taking care of personal grooming?: A Little Help from another person toileting, which includes using toliet, bedpan, or urinal?: A Little Help from another person bathing (including washing, rinsing, drying)?: A Little Help from another person to put on and taking off regular upper body clothing?: A Little Help from another person to put on and taking off regular lower body clothing?: A Little 6 Click Score: 19   End of Session Equipment Utilized During Treatment: (left platform walker) Nurse Communication: Mobility status(requesting xray for left wrist)  Activity Tolerance: Patient tolerated treatment well Patient left: in bed;with call bell/phone within reach;with family/visitor present  OT Visit Diagnosis: Unsteadiness on feet (R26.81);Pain Pain - Right/Left: Left Pain - part of body: Arm;Ankle and joints of foot                Time: 1020-1035 OT Time Calculation (min): 15 min Charges:  OT General Charges $OT Visit: 1 Visit OT Evaluation $OT Eval Low Complexity: 1 Low G-Codes: OT G-codes **NOT FOR INPATIENT CLASS** Functional Assessment Tool Used: Clinical judgement Functional Limitation: Self care Self Care Current Status (Z6109): At least 1  percent but less than 20 percent impaired, limited or restricted Self Care Goal Status (U0454): At least 1 percent but less than 20 percent impaired, limited or restricted     Cipriano Mile OTR/L 10/28/2017, 10:57 AM

## 2017-10-28 NOTE — Progress Notes (Signed)
Orthopedic Tech Progress Note Patient Details:  Baldo DaubRodney D Stenseth 03/29/1951 [id number]  Ortho Devices Type of Ortho Device: Arm sling, Sugartong splint Ortho Device/Splint Location: rue Ortho Device/Splint Interventions: Application   Post Interventions Patient Tolerated: Well Instructions Provided: Care of device   Nikki DomCrawford, Marybelle Giraldo 10/28/2017, 5:06 PM Viewed order from RN order list
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
## 2017-10-28 NOTE — Plan of Care (Signed)
Patient and wife verbalized  understanding of pain control, MAR reviewed including pain med and pain score reporting.  NWB overnight understood, pt using urinal, able to reposition self in bed.  Vitals remain stable during HS.

## 2017-10-28 NOTE — Progress Notes (Cosign Needed)
Pt c/o L wrist pain today xrays obtained with slightly displaced intra-articular distal radius fx. Discussed with Dr. Melvyn Novasrtmann on hand call today. Will place in splint. Since pt will be following up with Dr. Jena GaussHaddix Tuesday for further tx of talus fx we will have them take over tx for the wrist as well. I called Montez MoritaKeith Paul PA-C and discussed this with him and they will be happy to take over tx. Order placed for short arm sugar tong splint to be applied by ortho tech Ok to D/C after splint placed NWB LLE and LUE Ice and elevate L wrist, L ankle Discussed with Dr. Melvyn Novasrtmann, Dr. Linna CapriceSwinteck

## 2017-10-28 NOTE — Care Management Note (Signed)
Case Management Note  Patient Details  Name: Baldo DaubRodney D Danzy MRN: [id number] Date of Birth: 12/29/1950  Subjective/Objective:                 [facility]    Action/Plan:   Expected Discharge Date:                  Expected Discharge Plan:  Home/Self Care  In-House Referral:     Discharge planning Services  CM Consult  Post Acute Care Choice:  Durable Medical Equipment Choice offered to:     DME Arranged:  (platform walker) DME Agency:  [facility]  HH Arranged:    HH Agency:     Status of Service:  Completed, signed off  If discussed at Long Length of Stay Meetings, dates discussed:    Additional Comments:  Lawerance SabalDebbie Nihaal Friesen, RN 10/28/2017, 4:11 PM

## 2017-10-28 NOTE — Progress Notes (Signed)
Subjective: 1 Day Post-Op Procedure(s) (LRB): CLOSED  and OPEN REDUCTION ANKLE , (Left) Patient reports pain as mild.  Reports some pain about the ankle. No numbness or tingling. No other c/o. Denies CP, SOB, fever, chills, N/V.  Objective: Vital signs in last 24 hours: Temp:  [97.7 F (36.5 C)-99.7 F (37.6 C)] 98.6 F (37 C) (01/19 0426) Pulse Rate:  [63-94] 63 (01/19 0426) Resp:  [8-21] 16 (01/19 0426) BP: (114-173)/(63-95) 121/63 (01/19 0426) SpO2:  [94 %-100 %] 98 % (01/19 0426)  Intake/Output from previous day: 01/18 0701 - 01/19 0700 In: 1190 [P.O.:440; I.V.:750] Out: 790 [Urine:750; Blood:40] Intake/Output this shift: Total I/O In: 240 [P.O.:240] Out: 300 [Urine:300]  Recent Labs    10/27/17 1548  HGB 14.8   Recent Labs    10/27/17 1548  WBC 11.3*  RBC 4.44  HCT 44.5  PLT 181   Recent Labs    10/27/17 1548  NA 139  K 4.9  CL 107  CO2 24  BUN 16  CREATININE 1.17  GLUCOSE 124*  CALCIUM 8.2*   No results for input(s): LABPT, INR in the last 72 hours.  Neurologically intact ABD soft Neurovascular intact Sensation intact distally Intact pulses distally Dorsiflexion/Plantar flexion intact Incision: dressing C/D/I and no drainage No cellulitis present Compartment soft no sign of DVT  Assessment/Plan: 1 Day Post-Op Procedure(s) (LRB): CLOSED  and OPEN REDUCTION ANKLE , (Left) Advance diet Up with therapy D/C IV fluids  CT scan with comminuted talus fx. Dr. Aundria Rudogers will discuss with one of the ortho trauma specialists to see what further tx will be required Dr. Aundria Rudogers to update patient Keep today for pain control, ice and elevation LLE to reduce swelling NWB LLE, remain in splint Discussed with Dr. Linna CapriceSwinteck this AM  Andrez GrimeBISSELL, Bill Mcvey M. 10/28/2017, 8:20 AM

## 2017-10-28 NOTE — Progress Notes (Signed)
Called Lyndonville[place]issell, [place], [place] pt complained of left wrist pain and edema.  T/O 2 view X-ray AP and Lateral of left wrist.

## 2017-10-28 NOTE — Progress Notes (Signed)
Paged Rae LipsJacyln Bissell PA-C reference left wrist x-ray with mild fx and intra-articular involvement.

## 2017-11-01 ENCOUNTER — Encounter (HOSPITAL_COMMUNITY): Payer: Self-pay | Admitting: *Deleted

## 2017-11-01 ENCOUNTER — Other Ambulatory Visit: Payer: Self-pay
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
## 2017-11-01 NOTE — Progress Notes (Signed)
   11/01/17 1257  OBSTRUCTIVE SLEEP APNEA  Have you ever been diagnosed with sleep apnea through a sleep study? No  Do you snore loudly (loud enough to be heard through closed doors)?  1  Do you often feel tired, fatigued, or sleepy during the daytime (such as falling asleep during driving or talking to someone)? 0  Has anyone observed you stop breathing during your sleep? 1  Do you have, or are you being treated for high blood pressure? 1  BMI more than 35 kg/m2? (assess DOS)  Age > 50 (1-yes) 1  Neck circumference greater than:Male 16 inches or larger, Male 17inches or larger? 0  Male Gender (Yes=1) 1  Obstructive Sleep Apnea Score 5

## 2017-11-01 NOTE — Progress Notes (Signed)
Pt denies any acute cardiopulmonary issues. Pt under the care of Dr. Claris GowerKahl, Cardiology. Pt denies having an echo but stated that a stress test was performed >10 years ago " around the time I had the cath." [facility] Dr[facility] Claris GowerKahl, Cardiology. [facility], regarding orders and pt pre-op instructions for Aspirin and Plavix. Gwen to make MD aware and f/u with pt regarding medications. Pt made aware to stop taking vitamins, fish oil, Claritin-D and herbal medications. Do not take any NSAIDs ie: Ibuprofen, Advil, Naproxen (Aleve), Motrin, BC and Goody Powder. Pt verbalized understanding of all pre-op instructions.

## 2017-11-02 ENCOUNTER — Ambulatory Visit (HOSPITAL_COMMUNITY): Payer: Medicare Other | Admitting: Anesthesiology

## 2017-11-02 ENCOUNTER — Encounter (HOSPITAL_COMMUNITY): Payer: Self-pay | Admitting: *Deleted

## 2017-11-02 ENCOUNTER — Ambulatory Visit (HOSPITAL_COMMUNITY): Payer: Medicare Other

## 2017-11-02 ENCOUNTER — Ambulatory Visit (HOSPITAL_COMMUNITY)
Admission: RE | Admit: 2017-11-02 | Discharge: 2017-11-02 | Disposition: A | Discharge status: Home / Self Care | Payer: Medicare Other | Source: Ambulatory Visit | Attending: Student | Admitting: Student

## 2017-11-02 ENCOUNTER — Encounter (HOSPITAL_COMMUNITY)
Admission: RE | Disposition: A | Discharge status: Home / Self Care | Payer: Self-pay | Source: Ambulatory Visit | Attending: Student

## 2017-11-02 DIAGNOSIS — S9305XA Dislocation of left ankle joint, initial encounter: Secondary | ICD-10-CM | POA: Insufficient documentation

## 2017-11-02 DIAGNOSIS — M1990 Unspecified osteoarthritis, unspecified site: Secondary | ICD-10-CM | POA: Insufficient documentation

## 2017-11-02 DIAGNOSIS — Z7902 Long term (current) use of antithrombotics/antiplatelets: Secondary | ICD-10-CM | POA: Insufficient documentation

## 2017-11-02 DIAGNOSIS — I10 Essential (primary) hypertension: Secondary | ICD-10-CM | POA: Insufficient documentation

## 2017-11-02 DIAGNOSIS — S8262XA Displaced fracture of lateral malleolus of left fibula, initial encounter for closed fracture: Secondary | ICD-10-CM | POA: Diagnosis not present

## 2017-11-02 DIAGNOSIS — Z7982 Long term (current) use of aspirin: Secondary | ICD-10-CM | POA: Diagnosis not present

## 2017-11-02 DIAGNOSIS — S92142A Displaced dome fracture of left talus, initial encounter for closed fracture: Secondary | ICD-10-CM | POA: Diagnosis not present

## 2017-11-02 DIAGNOSIS — Z955 Presence of coronary angioplasty implant and graft: Secondary | ICD-10-CM | POA: Diagnosis not present

## 2017-11-02 DIAGNOSIS — Z8249 Family history of ischemic heart disease and other diseases of the circulatory system: Secondary | ICD-10-CM | POA: Diagnosis not present

## 2017-11-02 DIAGNOSIS — I2510 Atherosclerotic heart disease of native coronary artery without angina pectoris: Secondary | ICD-10-CM | POA: Insufficient documentation

## 2017-11-02 DIAGNOSIS — Z87891 Personal history of nicotine dependence: Secondary | ICD-10-CM | POA: Insufficient documentation

## 2017-11-02 DIAGNOSIS — S52572A Other intraarticular fracture of lower end of left radius, initial encounter for closed fracture: Secondary | ICD-10-CM | POA: Diagnosis not present

## 2017-11-02 DIAGNOSIS — E785 Hyperlipidemia, unspecified: Secondary | ICD-10-CM | POA: Insufficient documentation

## 2017-11-02 DIAGNOSIS — Z79899 Other long term (current) drug therapy: Secondary | ICD-10-CM | POA: Diagnosis not present

## 2017-11-02 DIAGNOSIS — Z419 Encounter for procedure for purposes other than remedying health state, unspecified: Secondary | ICD-10-CM

## 2017-11-02 DIAGNOSIS — M24072 Loose body in left ankle: Secondary | ICD-10-CM | POA: Insufficient documentation

## 2017-11-02 DIAGNOSIS — S82839A Other fracture of upper and lower end of unspecified fibula, initial encounter for closed fracture: Secondary | ICD-10-CM

## 2017-11-02 HISTORY — PX: ORIF ANKLE FRACTURE: SHX5408

## 2017-11-02 HISTORY — DX: Presence of external hearing-aid: Z97.4

## 2017-11-02 HISTORY — DX: Unspecified fracture of shaft of humerus, left arm, initial encounter for closed fracture: S42.302A

## 2017-11-02 HISTORY — DX: Other fracture of unspecified lower leg, initial encounter for closed fracture: S82.899A

## 2017-11-02 HISTORY — DX: Unspecified osteoarthritis, unspecified site: M19.90

## 2017-11-02 HISTORY — PX: CAST APPLICATION: SHX380

## 2017-11-02 HISTORY — DX: Presence of spectacles and contact lenses: Z97.3

## 2017-11-02 SURGERY — OPEN REDUCTION INTERNAL FIXATION (ORIF) ANKLE FRACTURE
Anesthesia: Regional | Site: Wrist | Laterality: Left

## 2017-11-02 MED ORDER — ROCURONIUM BROMIDE 100 MG/10ML IV SOLN
INTRAVENOUS | Status: DC | PRN
  Administered 2017-11-02: 20 mg via INTRAVENOUS
  Administered 2017-11-02: 40 mg via INTRAVENOUS

## 2017-11-02 MED ORDER — MIDAZOLAM HCL 2 MG/2ML IJ SOLN
INTRAMUSCULAR | Status: AC
  Filled 2017-11-02: qty 2

## 2017-11-02 MED ORDER — FENTANYL CITRATE (PF) 100 MCG/2ML IJ SOLN
25.0000 ug | INTRAMUSCULAR | Status: DC | PRN
  Administered 2017-11-02: 25 ug via INTRAVENOUS
  Administered 2017-11-02: 50 ug via INTRAVENOUS
  Administered 2017-11-02: 25 ug via INTRAVENOUS

## 2017-11-02 MED ORDER — LIDOCAINE HCL (CARDIAC) 20 MG/ML IV SOLN
INTRAVENOUS | Status: DC | PRN
  Administered 2017-11-02: 60 mg via INTRAVENOUS

## 2017-11-02 MED ORDER — MIDAZOLAM HCL 5 MG/5ML IJ SOLN
INTRAMUSCULAR | Status: DC | PRN
  Administered 2017-11-02: 2 mg via INTRAVENOUS

## 2017-11-02 MED ORDER — LACTATED RINGERS IV SOLN
INTRAVENOUS | Status: DC | PRN
  Administered 2017-11-02 (×2): via INTRAVENOUS

## 2017-11-02 MED ORDER — ONDANSETRON HCL 4 MG/2ML IJ SOLN
INTRAMUSCULAR | Status: DC | PRN
  Administered 2017-11-02: 4 mg via INTRAVENOUS

## 2017-11-02 MED ORDER — MIDAZOLAM HCL 5 MG/5ML IJ SOLN: INTRAMUSCULAR | Status: DC | PRN

## 2017-11-02 MED ORDER — LACTATED RINGERS IV SOLN
INTRAVENOUS | Status: DC
  Administered 2017-11-02: 07:00:00 via INTRAVENOUS

## 2017-11-02 MED ORDER — LIDOCAINE 2% (20 MG/ML) 5 ML SYRINGE
INTRAMUSCULAR | Status: AC
  Filled 2017-11-02: qty 5

## 2017-11-02 MED ORDER — ONDANSETRON HCL 4 MG/2ML IJ SOLN
INTRAMUSCULAR | Status: AC
  Filled 2017-11-02: qty 4

## 2017-11-02 MED ORDER — DEXAMETHASONE SODIUM PHOSPHATE 10 MG/ML IJ SOLN
INTRAMUSCULAR | Status: DC | PRN
  Administered 2017-11-02: 10 mg via INTRAVENOUS

## 2017-11-02 MED ORDER — PROPOFOL 10 MG/ML IV BOLUS
INTRAVENOUS | Status: AC
  Filled 2017-11-02: qty 20

## 2017-11-02 MED ORDER — PHENYLEPHRINE 40 MCG/ML (10ML) SYRINGE FOR IV PUSH (FOR BLOOD PRESSURE SUPPORT)
PREFILLED_SYRINGE | INTRAVENOUS | Status: AC
  Filled 2017-11-02: qty 10

## 2017-11-02 MED ORDER — CEFAZOLIN SODIUM-DEXTROSE 2-4 GM/100ML-% IV SOLN
2.0000 g | INTRAVENOUS | Status: AC
  Administered 2017-11-02: 2 g via INTRAVENOUS

## 2017-11-02 MED ORDER — FENTANYL CITRATE (PF) 250 MCG/5ML IJ SOLN
INTRAMUSCULAR | Status: AC
  Filled 2017-11-02: qty 5

## 2017-11-02 MED ORDER — FENTANYL CITRATE (PF) 250 MCG/5ML IJ SOLN
INTRAMUSCULAR | Status: AC
Start: 2017-11-02 — End: 2017-11-02
  Filled 2017-11-02: qty 5

## 2017-11-02 MED ORDER — BACITRACIN ZINC 500 UNIT/GM EX OINT
TOPICAL_OINTMENT | CUTANEOUS | Status: AC
  Filled 2017-11-02: qty 28.35

## 2017-11-02 MED ORDER — VANCOMYCIN HCL 500 MG IV SOLR
INTRAVENOUS | Status: AC
  Filled 2017-11-02: qty 500

## 2017-11-02 MED ORDER — FENTANYL CITRATE (PF) 100 MCG/2ML IJ SOLN: INTRAMUSCULAR | Status: DC | PRN

## 2017-11-02 MED ORDER — PROPOFOL 10 MG/ML IV BOLUS
INTRAVENOUS | Status: DC | PRN
  Administered 2017-11-02: 200 mg via INTRAVENOUS

## 2017-11-02 MED ORDER — FENTANYL CITRATE (PF) 100 MCG/2ML IJ SOLN
INTRAMUSCULAR | Status: AC
  Filled 2017-11-02: qty 2

## 2017-11-02 MED ORDER — DEXAMETHASONE SODIUM PHOSPHATE 10 MG/ML IJ SOLN
INTRAMUSCULAR | Status: AC
  Filled 2017-11-02: qty 2

## 2017-11-02 MED ORDER — PHENYLEPHRINE HCL 10 MG/ML IJ SOLN
INTRAMUSCULAR | Status: DC | PRN
  Administered 2017-11-02 (×3): 80 ug via INTRAVENOUS

## 2017-11-02 MED ORDER — CHLORHEXIDINE GLUCONATE 4 % EX LIQD: 60.0000 mL | Freq: Once | CUTANEOUS | Status: DC

## 2017-11-02 MED ORDER — ONDANSETRON HCL 4 MG/2ML IJ SOLN: 4.0000 mg | Freq: Once | INTRAMUSCULAR | Status: DC | PRN

## 2017-11-02 MED ORDER — FENTANYL CITRATE (PF) 100 MCG/2ML IJ SOLN
INTRAMUSCULAR | Status: DC | PRN
  Administered 2017-11-02: 100 ug via INTRAVENOUS
  Administered 2017-11-02 (×2): 50 ug via INTRAVENOUS

## 2017-11-02 MED ORDER — VANCOMYCIN HCL 1000 MG IV SOLR
INTRAVENOUS | Status: DC | PRN
  Administered 2017-11-02: 500 mg via TOPICAL

## 2017-11-02 MED ORDER — ROCURONIUM BROMIDE 10 MG/ML (PF) SYRINGE
PREFILLED_SYRINGE | INTRAVENOUS | Status: AC
  Filled 2017-11-02: qty 5

## 2017-11-02 MED ORDER — OXYCODONE HCL 5 MG PO TABS: 5.0000 mg | ORAL_TABLET | ORAL | 0 refills | Status: AC | PRN

## 2017-11-02 MED ORDER — SUGAMMADEX SODIUM 200 MG/2ML IV SOLN
INTRAVENOUS | Status: DC | PRN
  Administered 2017-11-02: 200 mg via INTRAVENOUS

## 2017-11-02 MED ORDER — BACITRACIN ZINC 500 UNIT/GM EX OINT
TOPICAL_OINTMENT | CUTANEOUS | Status: DC | PRN
  Administered 2017-11-02: 1 via TOPICAL

## 2017-11-02 MED ORDER — PHENYLEPHRINE HCL 10 MG/ML IJ SOLN
INTRAVENOUS | Status: DC | PRN
  Administered 2017-11-02: 30 ug/min via INTRAVENOUS

## 2017-11-02 MED ORDER — OXYCODONE HCL 5 MG PO TABS
ORAL_TABLET | ORAL | Status: AC
  Administered 2017-11-02: 5 mg
  Filled 2017-11-02: qty 1

## 2017-11-02 MED ORDER — 0.9 % SODIUM CHLORIDE (POUR BTL) OPTIME
TOPICAL | Status: DC | PRN
  Administered 2017-11-02: 1000 mL

## 2017-11-02 SURGICAL SUPPLY — 64 items
BANDAGE ACE 4X5 VEL STRL LF (GAUZE/BANDAGES/DRESSINGS) IMPLANT
BANDAGE ACE 6X5 VEL STRL LF (GAUZE/BANDAGES/DRESSINGS) IMPLANT
BANDAGE ELASTIC 4 VELCRO ST LF (GAUZE/BANDAGES/DRESSINGS) ×4 IMPLANT
BANDAGE ELASTIC 6 VELCRO ST LF (GAUZE/BANDAGES/DRESSINGS) ×4 IMPLANT
BANDAGE ESMARK 6X9 LF (GAUZE/BANDAGES/DRESSINGS) ×2 IMPLANT
BIT DRILL CALIBRATED 1.8MM (BIT) ×2 IMPLANT
BNDG COHESIVE 4X5 TAN STRL (GAUZE/BANDAGES/DRESSINGS) ×4 IMPLANT
BNDG ESMARK 6X9 LF (GAUZE/BANDAGES/DRESSINGS) ×4
BRUSH SCRUB SURG 4.25 DISP (MISCELLANEOUS) ×8 IMPLANT
CHLORAPREP W/TINT 26ML (MISCELLANEOUS) ×4 IMPLANT
COVER MAYO STAND STRL (DRAPES) ×4 IMPLANT
COVER SURGICAL LIGHT HANDLE (MISCELLANEOUS) ×4 IMPLANT
DRAPE C-ARM 42X72 X-RAY (DRAPES) ×4 IMPLANT
DRAPE C-ARMOR (DRAPES) ×4 IMPLANT
DRAPE HALF SHEET 40X57 (DRAPES) ×8 IMPLANT
DRAPE ORTHO SPLIT 77X108 STRL (DRAPES) ×4
DRAPE SURG ORHT 6 SPLT 77X108 (DRAPES) ×4 IMPLANT
DRAPE U-SHAPE 47X51 STRL (DRAPES) ×4 IMPLANT
DRILL CALIBRATED 1.8MM (BIT) ×4
DRSG EMULSION OIL 3X3 NADH (GAUZE/BANDAGES/DRESSINGS) ×8 IMPLANT
ELECT CAUTERY BLADE 6.4 (BLADE) ×4 IMPLANT
ELECT REM PT RETURN 9FT ADLT (ELECTROSURGICAL) ×4
ELECTRODE REM PT RTRN 9FT ADLT (ELECTROSURGICAL) ×2 IMPLANT
GAUZE SPONGE 4X4 12PLY STRL (GAUZE/BANDAGES/DRESSINGS) ×4 IMPLANT
GLOVE BIO SURGEON STRL SZ7.5 (GLOVE) ×16 IMPLANT
GLOVE BIOGEL PI IND STRL 7.5 (GLOVE) ×2 IMPLANT
GLOVE BIOGEL PI INDICATOR 7.5 (GLOVE) ×2
GOWN STRL REUS W/ TWL LRG LVL3 (GOWN DISPOSABLE) ×4 IMPLANT
GOWN STRL REUS W/TWL LRG LVL3 (GOWN DISPOSABLE) ×4
KIT ROOM TURNOVER OR (KITS) ×4 IMPLANT
MANIFOLD NEPTUNE II (INSTRUMENTS) ×4 IMPLANT
NEEDLE HYPO 21X1.5 SAFETY (NEEDLE) IMPLANT
NS IRRIG 1000ML POUR BTL (IV SOLUTION) ×4 IMPLANT
PACK TOTAL JOINT (CUSTOM PROCEDURE TRAY) ×4 IMPLANT
PACK UNIVERSAL I (CUSTOM PROCEDURE TRAY) ×4 IMPLANT
PAD ABD 8X10 STRL (GAUZE/BANDAGES/DRESSINGS) ×4 IMPLANT
PAD ARMBOARD 7.5X6 YLW CONV (MISCELLANEOUS) ×8 IMPLANT
PAD CAST 4YDX4 CTTN HI CHSV (CAST SUPPLIES) ×2 IMPLANT
PADDING CAST COTTON 4X4 STRL (CAST SUPPLIES) ×2
PADDING CAST COTTON 6X4 STRL (CAST SUPPLIES) ×4 IMPLANT
PADDING CAST SYNTHETIC 3 NS LF (CAST SUPPLIES) ×2
PADDING CAST SYNTHETIC 3X4 NS (CAST SUPPLIES) ×2 IMPLANT
PADDING CAST SYNTHETIC 4 (CAST SUPPLIES) ×2
PADDING CAST SYNTHETIC 4X4 STR (CAST SUPPLIES) ×2 IMPLANT
SCOTCHCAST PLUS 3X4 WHITE (CAST SUPPLIES) ×8 IMPLANT
SCREW CORTEX SLFTPNG 34MM 2.4 (Screw) ×4 IMPLANT
SCREW CORTEX SLFTPNG 36MM 2.4 (Screw) ×4 IMPLANT
SPLINT PLASTER CAST XFAST 5X30 (CAST SUPPLIES) ×2 IMPLANT
SPLINT PLASTER XFAST SET 5X30 (CAST SUPPLIES) ×2
SPONGE LAP 18X18 X RAY DECT (DISPOSABLE) ×4 IMPLANT
STAPLER VISISTAT 35W (STAPLE) ×4 IMPLANT
SUCTION FRAZIER HANDLE 10FR (MISCELLANEOUS) ×2
SUCTION TUBE FRAZIER 10FR DISP (MISCELLANEOUS) ×2 IMPLANT
SUT ETHILON 3 0 PS 1 (SUTURE) ×8 IMPLANT
SUT PROLENE 0 CT (SUTURE) IMPLANT
SUT VIC AB 2-0 CT1 27 (SUTURE) ×4
SUT VIC AB 2-0 CT1 TAPERPNT 27 (SUTURE) ×4 IMPLANT
TOWEL OR 17X24 6PK STRL BLUE (TOWEL DISPOSABLE) ×4 IMPLANT
TOWEL OR 17X26 10 PK STRL BLUE (TOWEL DISPOSABLE) ×8 IMPLANT
TUBE CONNECTING 12'X1/4 (SUCTIONS) ×2
TUBE CONNECTING 12X1/4 (SUCTIONS) ×6 IMPLANT
UNDERPAD 30X30 (UNDERPADS AND DIAPERS) ×4 IMPLANT
WATER STERILE IRR 1000ML POUR (IV SOLUTION) ×4 IMPLANT
YANKAUER SUCT BULB TIP NO VENT (SUCTIONS) ×4 IMPLANT

## 2017-11-02 NOTE — Anesthesia Preprocedure Evaluation (Addendum)
Anesthesia Evaluation  Patient identified by MRN, date of birth, ID band Patient awake    Reviewed: Allergy & Precautions, NPO status , Patient's Chart, lab work & pertinent test results  Airway Mallampati: II  TM Distance: >3 FB Neck ROM: Full    Dental  (+) Missing,    Pulmonary former smoker,    Pulmonary exam normal breath sounds clear to auscultation       Cardiovascular Exercise Tolerance: Good hypertension, Pt. on medications + CAD and + Cardiac Stents (x 3, last placed in 2008)  Normal cardiovascular exam Rhythm:Regular Rate:Normal  Sees cardiologist Claris Gower(Kahl) with [facility]   Neuro/Psych negative neurological ROS  negative psych ROS   GI/Hepatic negative GI ROS, Neg liver ROS,   Endo/Other  negative endocrine ROS  Renal/GU negative Renal ROS     Musculoskeletal negative musculoskeletal ROS (+)   Abdominal   Peds  Hematology HLD   Anesthesia Other Findings left talus fracture   left distal radius fracture  Reproductive/Obstetrics                             Anesthesia Physical  Anesthesia Plan  ASA: III  Anesthesia Plan: General   Post-op Pain Management:    Induction: Intravenous  PONV Risk Score and Plan: 2 and Ondansetron, Treatment may vary due to age or medical condition, Midazolam and Dexamethasone  Airway Management Planned: LMA  Additional Equipment:   Intra-op Plan:   Post-operative Plan: Extubation in OR  Informed Consent: I have reviewed the patients History and Physical, chart, labs and discussed the procedure including the risks, benefits and alternatives for the proposed anesthesia with the patient or authorized representative who has indicated his/her understanding and acceptance.   Dental advisory given  Plan Discussed with: CRNA  Anesthesia Plan Comments: (Potential post op regional discussed with family)       Anesthesia Quick Evaluation

## 2017-11-02 NOTE — Anesthesia Procedure Notes (Signed)
Procedure Name: Intubation Date/Time: 11/02/2017 8:02 AM Performed by: Neldon Newport, CRNA Pre-anesthesia Checklist: Timeout performed, Patient being monitored, Suction available, Emergency Drugs available and Patient identified Patient Re-evaluated:Patient Re-evaluated prior to induction Oxygen Delivery Method: Circle system utilized Preoxygenation: Pre-oxygenation with 100% oxygen Induction Type: IV induction Ventilation: Two handed mask ventilation required and Oral airway inserted - appropriate to patient size Laryngoscope Size: Mac, 3 and Glidescope Grade View: Grade III Tube type: Oral Tube size: 7.5 mm Number of attempts: 2 Placement Confirmation: ETT inserted through vocal cords under direct vision,  positive ETCO2 and breath sounds checked- equal and bilateral Secured at: 22 cm Tube secured with: Tape Dental Injury: Teeth and Oropharynx as per pre-operative assessment

## 2017-11-02 NOTE — Discharge Instructions (Addendum)
Orthopaedic Trauma Service Discharge Instructions   General Discharge Instructions  WEIGHT BEARING STATUS: Non weight bearing left leg and left arm. May use elbow on left arm to bear weight  RANGE OF MOTION/ACTIVITY: Keep splint clean, dry and intact  Wound Care: Do not remove splint  DVT/PE prophylaxis: Continue plavix and aspirin  Diet: as you were eating previously.  Can use over the counter stool softeners and bowel preparations, such as Miralax, to help with bowel movements.  Narcotics can be constipating.  Be sure to drink plenty of fluids  PAIN MEDICATION USE AND EXPECTATIONS  You have likely been given narcotic medications to help control your pain.  After a traumatic event that results in an fracture (broken bone) with or without surgery, it is ok to use narcotic pain medications to help control one's pain.  We understand that everyone responds to pain differently and each individual patient will be evaluated on a regular basis for the continued need for narcotic medications. Ideally, narcotic medication use should last no more than 6-8 weeks (coinciding with fracture healing).   As a patient it is your responsibility as well to monitor narcotic medication use and report the amount and frequency you use these medications when you come to your office visit.   We would also advise that if you are using narcotic medications, you should take a dose prior to therapy to maximize you participation.  IF YOU ARE ON NARCOTIC MEDICATIONS IT IS NOT PERMISSIBLE TO OPERATE A MOTOR VEHICLE (MOTORCYCLE/CAR/TRUCK/MOPED) OR HEAVY MACHINERY DO NOT MIX NARCOTICS WITH OTHER CNS (CENTRAL NERVOUS SYSTEM) DEPRESSANTS SUCH AS ALCOHOL   STOP SMOKING OR USING NICOTINE PRODUCTS!!!!  As discussed nicotine severely impairs your body's ability to heal surgical and traumatic wounds but also impairs bone healing.  Wounds and bone heal by forming microscopic blood vessels (angiogenesis) and nicotine is a  vasoconstrictor (essentially, shrinks blood vessels).  Therefore, if vasoconstriction occurs to these microscopic blood vessels they essentially disappear and are unable to deliver necessary nutrients to the healing tissue.  This is one modifiable factor that you can do to dramatically increase your chances of healing your injury.    (This means no smoking, no nicotine gum, patches, etc)  DO NOT USE NONSTEROIDAL ANTI-INFLAMMATORY DRUGS (NSAID'S)  Using products such as Advil (ibuprofen), Aleve (naproxen), Motrin (ibuprofen) for additional pain control during fracture healing can delay and/or prevent the healing response.  If you would like to take over the counter (OTC) medication, Tylenol (acetaminophen) is ok.  However, some narcotic medications that are given for pain control contain acetaminophen as well. Therefore, you should not exceed more than 4000 mg of tylenol in a day if you do not have liver disease.  Also note that there are may OTC medicines, such as cold medicines and allergy medicines that my contain tylenol as well.  If you have any questions about medications and/or interactions please ask your doctor/PA or your pharmacist.      ICE AND ELEVATE INJURED/OPERATIVE EXTREMITY  Using ice and elevating the injured extremity above your heart can help with swelling and pain control.  Icing in a pulsatile fashion, such as 20 minutes on and 20 minutes off, can be followed.    Do not place ice directly on skin. Make sure there is a barrier between to skin and the ice pack.    Using frozen items such as frozen peas works well as the conform nicely to the are that needs to be iced.  USE AN  ACE WRAP OR TED HOSE FOR SWELLING CONTROL  In addition to icing and elevation, Ace wraps or TED hose are used to help limit and resolve swelling.  It is recommended to use Ace wraps or TED hose until you are informed to stop.    When using Ace Wraps start the wrapping distally (farthest away from the body) and  wrap proximally (closer to the body)   Example: If you had surgery on your leg or thing and you do not have a splint on, start the ace wrap at the toes and work your way up to the thigh        If you had surgery on your upper extremity and do not have a splint on, start the ace wrap at your fingers and work your way up to the upper arm  IF YOU ARE IN A SPLINT OR CAST DO NOT REMOVE IT FOR ANY REASON   If your splint gets wet for any reason please contact the office immediately. You may shower in your splint or cast as long as you keep it dry.  This can be done by wrapping in a cast cover or garbage back (or similar)  Do Not stick any thing down your splint or cast such as pencils, money, or hangers to try and scratch yourself with.  If you feel itchy take benadryl as prescribed on the bottle for itching  CALL THE OFFICE WITH ANY QUESTIONS OR CONCERNS: [phone]

## 2017-11-02 NOTE — Anesthesia Postprocedure Evaluation (Signed)
Anesthesia Post Note  Patient: Baldo DaubRodney D Schmelzle  Procedure(s) Performed: OPEN REDUCTION INTERNAL FIXATION (ORIF) LEFT TALUS  FRACTURE (Left Ankle) CAST APPLICATION LEFT ARM (Left Wrist)     Patient location during evaluation: PACU Anesthesia Type: General Level of consciousness: awake and alert Pain management: pain level controlled Vital Signs Assessment: post-procedure vital signs reviewed and stable Respiratory status: spontaneous breathing, nonlabored ventilation, respiratory function stable and patient connected to nasal cannula oxygen Cardiovascular status: blood pressure returned to baseline and stable Postop Assessment: no apparent nausea or vomiting Anesthetic complications: no    Last Vitals:  Vitals:   11/02/17 1108 11/02/17 1110  BP:  (!) 156/72  Pulse:  60  Resp: 18 15  Temp:    SpO2:  99%    Last Pain:  Vitals:   11/02/17 1110  TempSrc:   PainSc: 3                  Ryan P Ellender
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
## 2017-11-02 NOTE — Transfer of Care (Signed)
Immediate Anesthesia Transfer of Care Note  Patient: Christopher Bates  Procedure(s) Performed: OPEN REDUCTION INTERNAL FIXATION (ORIF) LEFT TALUS  FRACTURE (Left Ankle) CAST APPLICATION LEFT ARM (Left Wrist)  Patient Location: PACU  Anesthesia Type:General  Level of Consciousness: awake, alert  and oriented  Airway & Oxygen Therapy: Patient Spontanous Breathing and Patient connected to face mask oxygen  Post-op Assessment: Report given to RN, Post -op Vital signs reviewed and stable and Patient moving all extremities X 4  Post vital signs: Reviewed and stable  Last Vitals:  Vitals:   11/02/17 0558  BP: 120/72  Pulse: 74  Resp: 18  Temp: 36.8 C  SpO2: 99%    Last Pain:  Vitals:   11/02/17 0558  TempSrc: Oral      Patients Stated Pain Goal: 8 (11/02/17 0713)  Complications: No apparent anesthesia complications

## 2017-11-03 DIAGNOSIS — S82839A Other fracture of upper and lower end of unspecified fibula, initial encounter for closed fracture: Secondary | ICD-10-CM

## 2017-11-06 ENCOUNTER — Encounter (HOSPITAL_COMMUNITY): Payer: Self-pay | Admitting: Student
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
## 2019-01-13 IMAGING — DX DG ANKLE PORT 2V*L*
2 series · 2 of 2 positions shown · non-contrast
Comparison: 10/27/2017

CLINICAL DATA: 66-year-old male status post open reduction of left
ankle fracture.

EXAM:
PORTABLE LEFT ANKLE - 2 VIEW

[ankle ap]
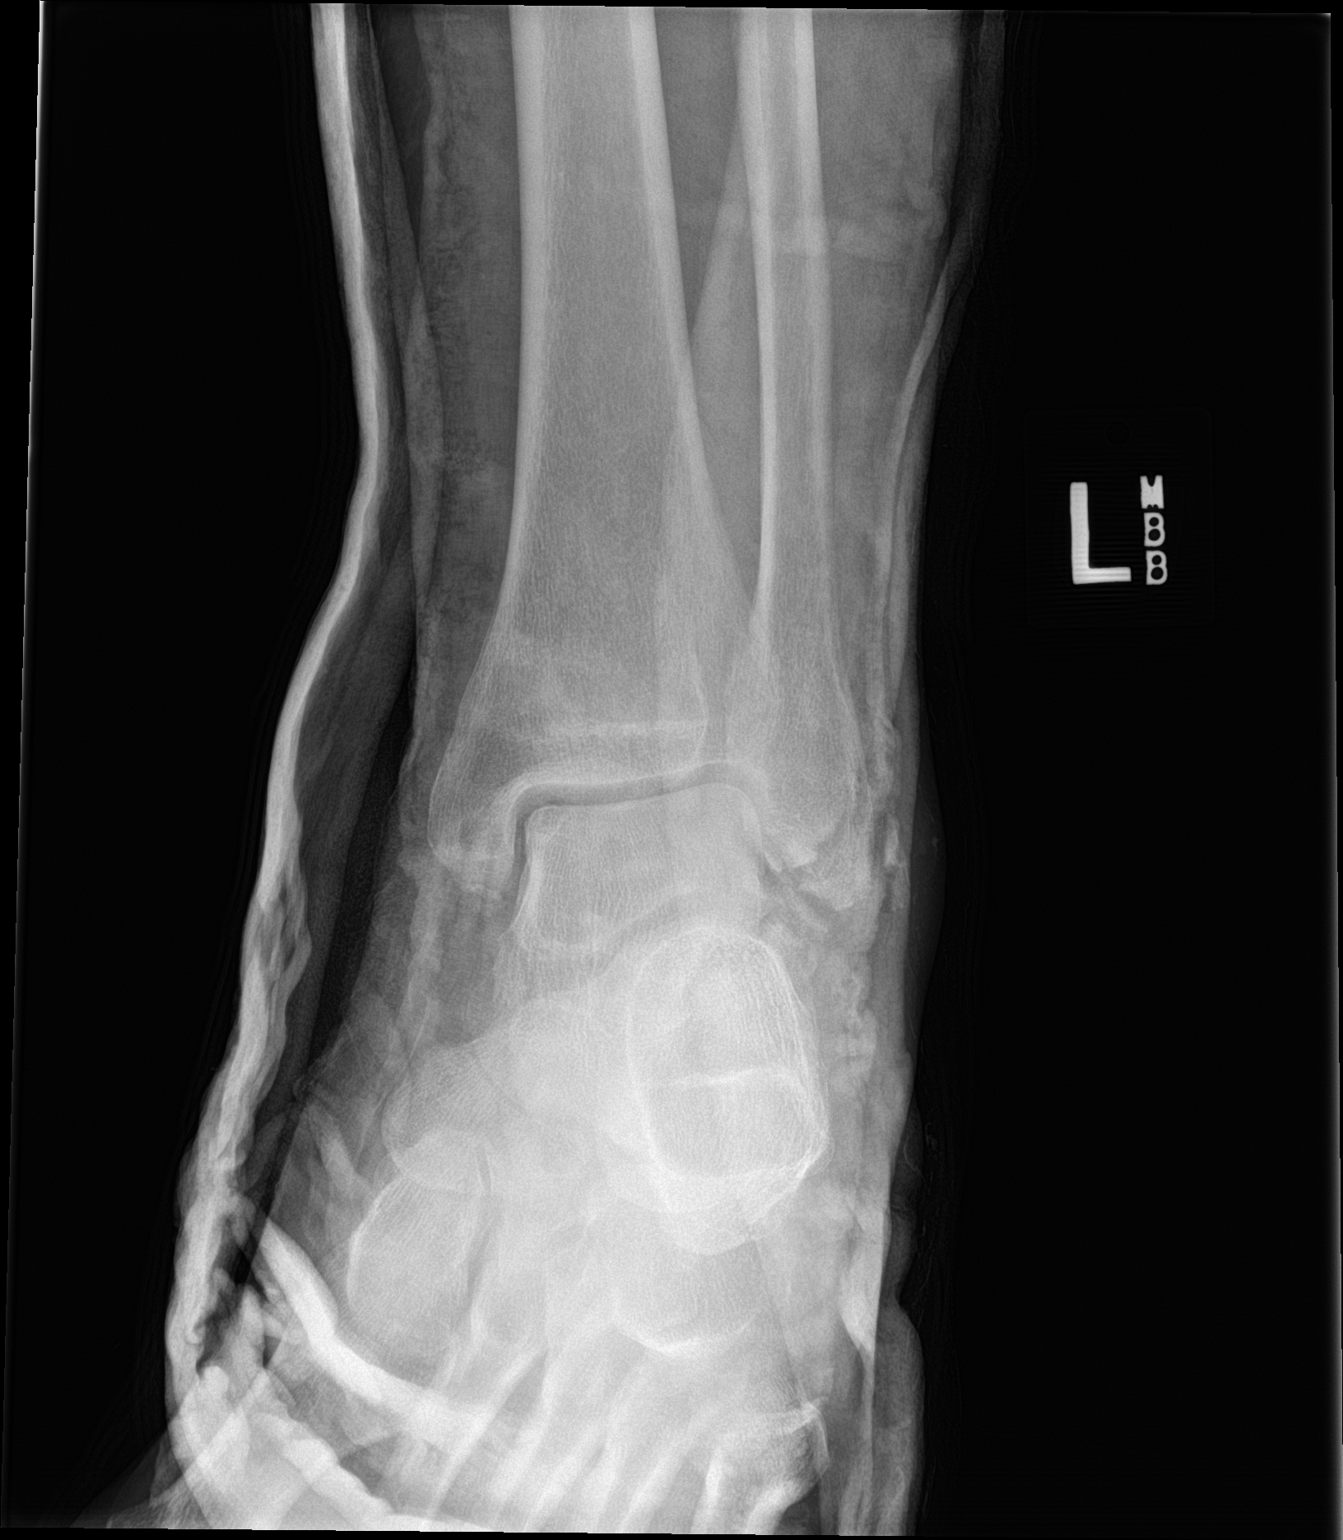

[ankle lat]
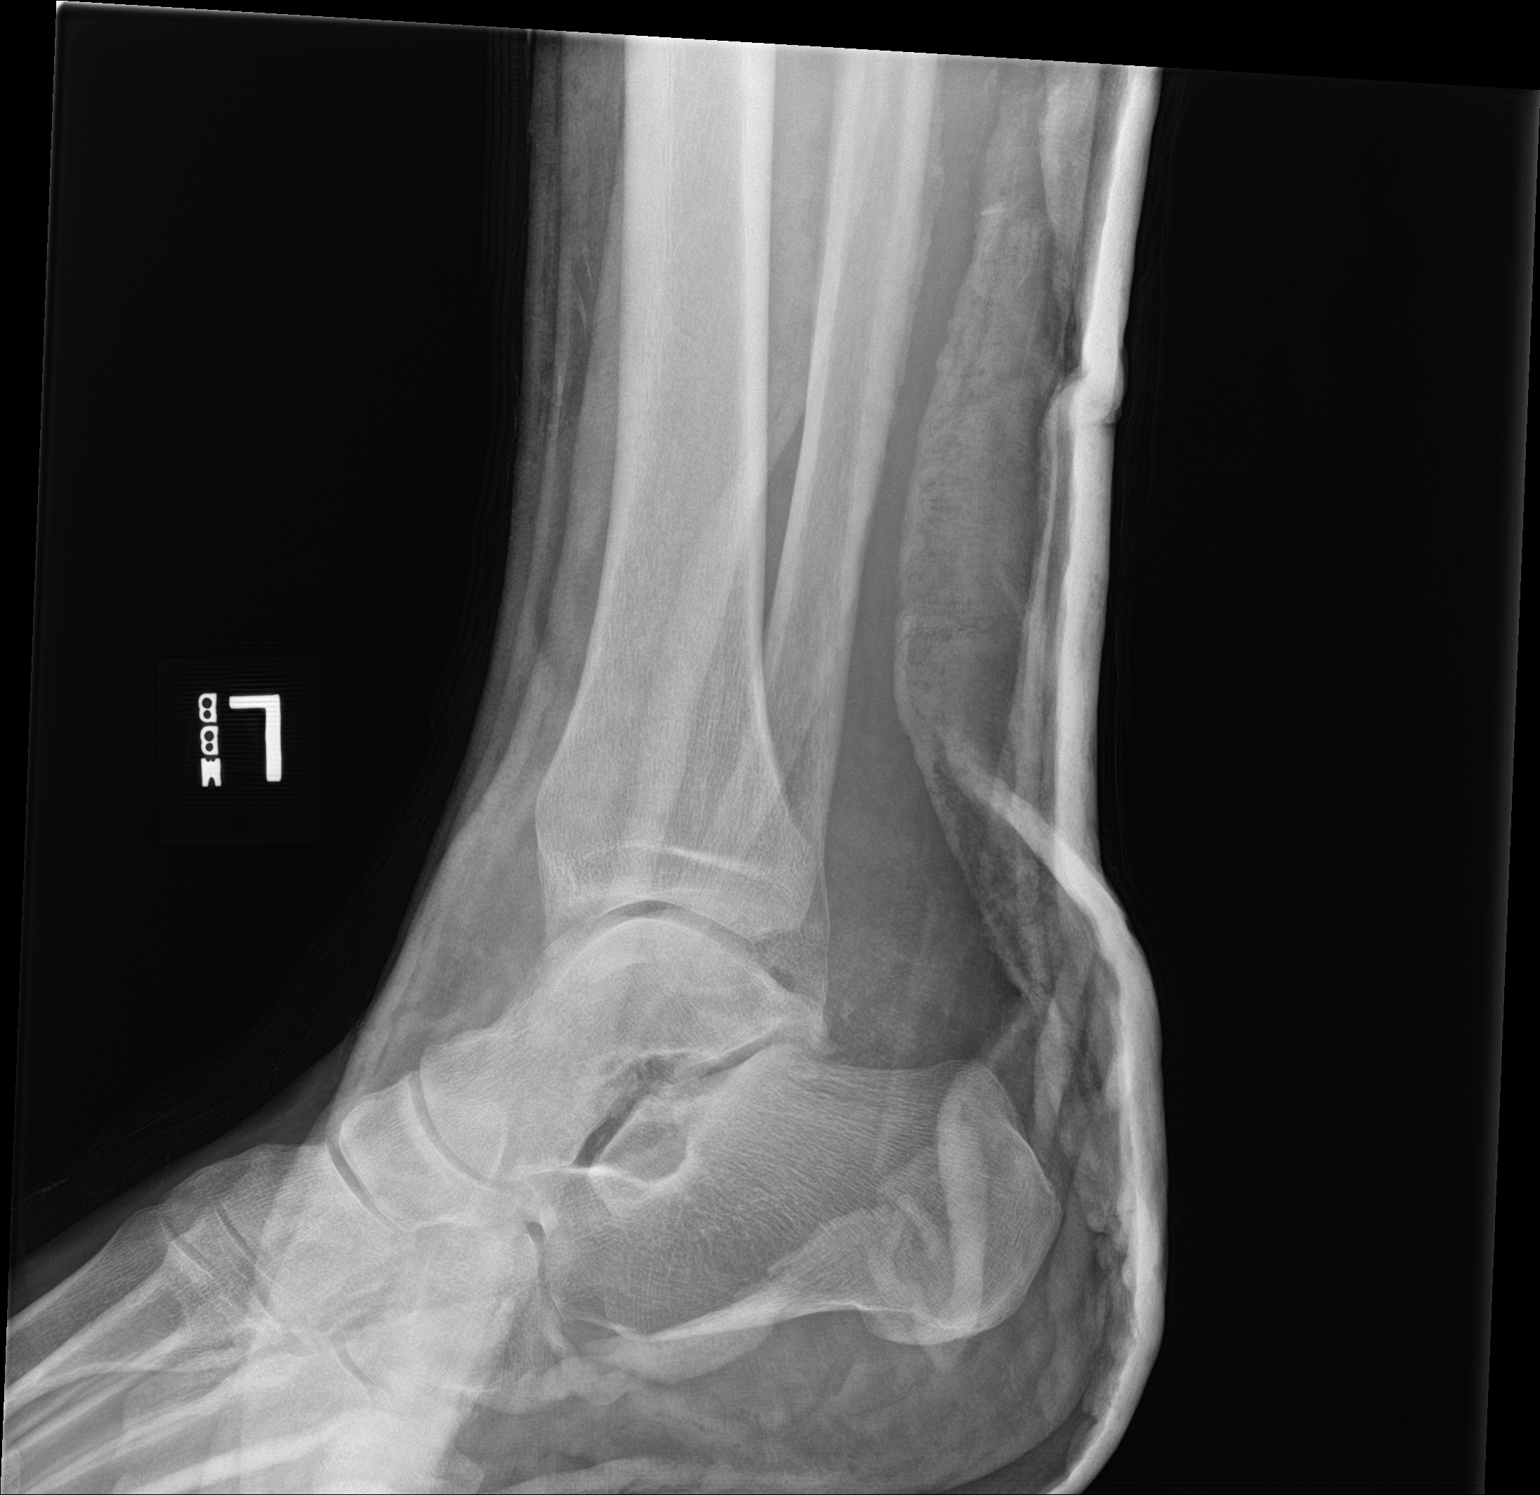

[2 of 2 positions shown; findings below may reference images not displayed]

FINDINGS: New cast material obscures finer bony detail. With these limitations
in mind the displacement of previously noted lateral malleolus
fracture has been significantly reduced, now near anatomic. Subtalar
joint has been relocated. Apparently normal talonavicular
articulation is noted on the lateral projection.
IMPRESSION: 1. Status post close reduction and cast fixation of previously noted
complex ankle/foot fracture/dislocation, with improved alignment as
above.
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
